# Patient Record
Sex: Male | Born: 1955 | Race: White | Hispanic: No | Marital: Married | Smoking: Never smoker
Health system: Southern US, Community
[De-identification: ages and names within clinical notes are randomized; demographics above are authoritative.]

## PROBLEM LIST (undated history)

## (undated) DIAGNOSIS — G4730 Sleep apnea, unspecified: Secondary | ICD-10-CM

## (undated) DIAGNOSIS — T7840XA Allergy, unspecified, initial encounter: Secondary | ICD-10-CM

## (undated) DIAGNOSIS — M1990 Unspecified osteoarthritis, unspecified site: Secondary | ICD-10-CM

## (undated) DIAGNOSIS — K219 Gastro-esophageal reflux disease without esophagitis: Secondary | ICD-10-CM

## (undated) DIAGNOSIS — E7800 Pure hypercholesterolemia, unspecified: Secondary | ICD-10-CM

## (undated) DIAGNOSIS — Z86718 Personal history of other venous thrombosis and embolism: Secondary | ICD-10-CM

## (undated) DIAGNOSIS — I2510 Atherosclerotic heart disease of native coronary artery without angina pectoris: Secondary | ICD-10-CM

## (undated) DIAGNOSIS — Z9889 Other specified postprocedural states: Secondary | ICD-10-CM

## (undated) DIAGNOSIS — I10 Essential (primary) hypertension: Secondary | ICD-10-CM

## (undated) DIAGNOSIS — Z87442 Personal history of urinary calculi: Secondary | ICD-10-CM

## (undated) DIAGNOSIS — Z8719 Personal history of other diseases of the digestive system: Secondary | ICD-10-CM

## (undated) DIAGNOSIS — J189 Pneumonia, unspecified organism: Secondary | ICD-10-CM

## (undated) DIAGNOSIS — T8859XA Other complications of anesthesia, initial encounter: Secondary | ICD-10-CM

## (undated) DIAGNOSIS — R0600 Dyspnea, unspecified: Secondary | ICD-10-CM

## (undated) DIAGNOSIS — N200 Calculus of kidney: Secondary | ICD-10-CM

## (undated) HISTORY — PX: OTHER SURGICAL HISTORY: SHX169

## (undated) HISTORY — PX: CHOLECYSTECTOMY: SHX55

## (undated) HISTORY — PX: UPPER GASTROINTESTINAL ENDOSCOPY: SHX188

## (undated) HISTORY — DX: Essential (primary) hypertension: I10

## (undated) HISTORY — PX: EYE SURGERY: SHX253

## (undated) HISTORY — PX: CATARACT EXTRACTION, BILATERAL: SHX1313

## (undated) HISTORY — PX: HERNIA REPAIR: SHX51

## (undated) HISTORY — DX: Calculus of kidney: N20.0

## (undated) HISTORY — DX: Personal history of other venous thrombosis and embolism: Z86.718

## (undated) HISTORY — DX: Allergy, unspecified, initial encounter: T78.40XA

## (undated) HISTORY — DX: Gastro-esophageal reflux disease without esophagitis: K21.9

## (undated) HISTORY — DX: Sleep apnea, unspecified: G47.30

## (undated) HISTORY — DX: Pure hypercholesterolemia, unspecified: E78.00

---

## 2006-09-25 ENCOUNTER — Emergency Department (HOSPITAL_COMMUNITY): Admission: EM | Admit: 2006-09-25 | Discharge: 2006-09-26 | Payer: Self-pay | Admitting: Emergency Medicine

## 2010-08-09 ENCOUNTER — Ambulatory Visit (HOSPITAL_BASED_OUTPATIENT_CLINIC_OR_DEPARTMENT_OTHER): Admission: RE | Admit: 2010-08-09 | Discharge: 2010-08-09 | Payer: Self-pay | Admitting: Ophthalmology

## 2010-12-10 LAB — POCT I-STAT, CHEM 8
BUN: 12 mg/dL (ref 6–23)
Calcium, Ion: 1.12 mmol/L (ref 1.12–1.32)
Creatinine, Ser: 1.2 mg/dL (ref 0.4–1.5)
Sodium: 141 mEq/L (ref 135–145)
TCO2: 24 mmol/L (ref 0–100)

## 2011-02-19 NOTE — Op Note (Signed)
NAMEDUVID, SMALLS NO.:  [id number]  MEDICAL RECORD NO.:  [id number]          PATIENT TYPE:  AMB  LOCATION:  DSC                          FACILITY:  [facility]  PHYSICIAN:  Pasty Spillers. Maple Hudson, M.D. 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  PROCEDURE: 1. Right superior oblique tuck, 10 mm. 2. Left inferior rectus muscle recession, 6.0 mm, adjustable     technique. 3. Left lateral rectus muscle recession, 4.0 mm.  SURGEON:  Pasty Spillers. Young, MD  ANESTHESIA:  General (laryngeal mask).  COMPLICATIONS:  None.  DESCRIPTION OF PROCEDURE:  After preoperative evaluation including informed consent, the patient was taken to the operating room where he was identified by me.  General anesthesia was induced without difficulty after placement of appropriate monitors.  The patient was prepped and draped in standard sterile fashion.  Lid speculum was placed in the right eye.  Through a superotemporal fornix incision through conjunctiva and Tenon fascia, the right superior rectus muscle was engaged on a series of muscle hooks.  The conjunctival incision was pulled over the nasal corner of the superior rectus insertion and drawn posteriorly with the Desmarres retractor to expose the tendon of the superior oblique along the nasal border of the superior rectus muscle.  The tendon was engaged on an oblique hook and then on a Bishop tendon tucker.  The tucker was drawn up to 5 mm each limb, to create a total tuck of 10 mm.  A double- arm 6-0 Mersilene suture was passed in horizontal mattress fashion through the tendon at the base of the tuck, anteriorly to posteriorly, then posteriorly to anteriorly, then tied securely  around the tendon.  The tucker was removed.  Conjunctiva was closed with two 6-0 plain gut sutures.  Attention was directed to the left eye, where an inferior limbal peritomy of 2 o'clock hours extent was made with Westcott scissors, relaxing incisions in the inferonasal and inferotemporal quadrants.  The peritomy was continued around to the 9 o'clock position to allow access to the lateral rectus muscle, which was engaged on a series of muscle hooks and cleared of its fascial attachments.  The tendon was secured with a double-arm 6-0 Vicryl suture, with a double-lockicking bite at each border of the muscle, 1 mm from the insertion.  The muscle was disinserted, and was reattached to sclera at a measured distance of 4.0 mm posterior to the original insertion, using direct scleral passes in crossed-swords fashion.  The suture ends were tied securely after the position of the muscle had been checked and found to be accurate.  The conjunctival flap was reapposed to the limbus, with a 6-0 plain gut suture.  The left inferior rectus muscle was then engaged on a series of muscle hooks and cleared of its fascial attachments.  The tendon was secured with a double-arm 6-0 Vicryl suture, with a double-locking bite at each border  of the muscle, 1 mm from the insertion.  The muscle was disinserted.  Each pole suture was passed back into the original muscle stump in crossed- swords fashion.  The muscle was drawn up to the level of the original insertion.  The two pole sutures were tied together approximately 10 cm above the sclera.  The pole sutures were then joined with a needle driver at a measured distance of 6.0 mm above sclera.  A noose suture of 6-0 Vicryl was tied around the pole sutures at this location.  The inferior rectus was then allowed to retract until the noose suture reached sclera, creating a 6-mm hang-back recession.  The nasal corner of the inferior conjunctival flap was reapposed  with a 6-0 plain gut suture.  The temporal corner of the inferior conjunctival flap was loosely joined to sclera with a large loop of 6-0 plain gut, leaving the flap open to facilitate suture adjustment.  A traction suture of 6-0 silk was placed at the inferior limbus.  TobraDex ointment was placed in each eye.  The patient was awakened without difficulty and taken to the recovery room in stable condition, having suffered no intraoperative or immediate postoperative complications.     Pasty Spillers. Maple Hudson, M.D.     Cheron Schaumann  D:  08/09/2010  T:  08/10/2010  Job:  [id number]  Electronically Signed by Verne Carrow M.D. on 02/19/2011 59:56:38 AM

## 2015-09-30 HISTORY — PX: OTHER SURGICAL HISTORY: SHX169

## 2018-06-02 DIAGNOSIS — Z0000 Encounter for general adult medical examination without abnormal findings: Secondary | ICD-10-CM

## 2019-10-10 ENCOUNTER — Encounter (HOSPITAL_COMMUNITY): Payer: Self-pay

## 2019-10-10 ENCOUNTER — Emergency Department (HOSPITAL_COMMUNITY): Payer: No Typology Code available for payment source

## 2019-10-10 ENCOUNTER — Emergency Department (HOSPITAL_COMMUNITY)
Admission: EM | Admit: 2019-10-10 | Discharge: 2019-10-11 | Disposition: A | Discharge status: Home / Self Care | Payer: No Typology Code available for payment source | Attending: Emergency Medicine | Admitting: Emergency Medicine

## 2019-10-10 ENCOUNTER — Other Ambulatory Visit: Payer: Self-pay

## 2019-10-10 DIAGNOSIS — R0789 Other chest pain: Secondary | ICD-10-CM | POA: Diagnosis present

## 2019-10-10 DIAGNOSIS — R072 Precordial pain: Secondary | ICD-10-CM | POA: Diagnosis not present

## 2019-10-10 LAB — CBC
HCT: 54.3 % — ABNORMAL HIGH (ref 39.0–52.0)
Hemoglobin: 17.9 g/dL — ABNORMAL HIGH (ref 13.0–17.0)
MCH: 31.1 pg (ref 26.0–34.0)
MCHC: 33 g/dL (ref 30.0–36.0)
MCV: 94.3 fL (ref 80.0–100.0)
Platelets: 234 10*3/uL (ref 150–400)
RBC: 5.76 MIL/uL (ref 4.22–5.81)
RDW: 14.2 % (ref 11.5–15.5)
WBC: 13 10*3/uL — ABNORMAL HIGH (ref 4.0–10.5)
nRBC: 0 % (ref 0.0–0.2)

## 2019-10-10 LAB — BASIC METABOLIC PANEL
Anion gap: 11 (ref 5–15)
BUN: 19 mg/dL (ref 8–23)
CO2: 25 mmol/L (ref 22–32)
Calcium: 9.4 mg/dL (ref 8.9–10.3)
Chloride: 103 mmol/L (ref 98–111)
Creatinine, Ser: 1.02 mg/dL (ref 0.61–1.24)
GFR calc Af Amer: >60 mL/min (ref >60)
GFR calc non Af Amer: >60 mL/min (ref >60)
Glucose, Bld: 99 mg/dL (ref 70–99)
Potassium: 3.9 mmol/L (ref 3.5–5.1)
Sodium: 139 mmol/L (ref 135–145)

## 2019-10-10 LAB — TROPONIN I (HIGH SENSITIVITY)
Troponin I (High Sensitivity): <2 ng/L (ref <18)
Troponin I (High Sensitivity): 2 ng/L (ref <18)

## 2019-10-10 MED ORDER — ALUM & MAG HYDROXIDE-SIMETH 200-200-20 MG/5ML PO SUSP
15.0000 mL | Freq: Once | ORAL | Status: AC
  Administered 2019-10-11: 15 mL via ORAL
  Filled 2019-10-10: qty 30

## 2019-10-10 MED ORDER — FAMOTIDINE 20 MG PO TABS
20.0000 mg | ORAL_TABLET | Freq: Once | ORAL | Status: AC
  Administered 2019-10-11: 20 mg via ORAL
  Filled 2019-10-10: qty 1

## 2019-10-10 MED ORDER — ACETAMINOPHEN 500 MG PO TABS
1000.0000 mg | ORAL_TABLET | Freq: Once | ORAL | Status: AC
  Administered 2019-10-11: 1000 mg via ORAL
  Filled 2019-10-10: qty 2

## 2019-10-10 MED ORDER — SODIUM CHLORIDE 0.9% FLUSH: 3.0000 mL | Freq: Once | INTRAVENOUS | Status: DC

## 2019-10-10 NOTE — ED Triage Notes (Signed)
Per EMS- patient c/o constant left chest pain and SOB since last night. Patient also c/o tingling of th eleft am and fingers.

## 2019-10-10 NOTE — ED Provider Notes (Signed)
[facility] DEPT Provider Note   CSN: [id number] Arrival date & time: 10/10/19  1526     History Chief Complaint  Patient presents with  . Chest Pain    Stephen Nash is a 64 y.o. male.  Patient w hx cad/stent 2017, c/o mid chest pain since last pm around 10 pm. Symptoms occur at rest, moderate, dull, constant, persistent, nonradiating, not pleuritic. Denies chest wall injury or strain. Denies cough or uri symptoms.  No known covid + exposure. Denies any recent exertional cp or discomfort, denies any unusual fatigue or doe. No leg pain or swelling. No hx dvt or pe. ?hx reflux - occasional heartburn. States current symptoms unlike cp prior to stent, which was a heaviness and smothering sensation in chest/neck - no recent similar symptoms.   The history is provided by the patient.  Chest Pain Associated symptoms: no abdominal pain, no back pain, no cough, no fever, no headache, no nausea, no palpitations, no shortness of breath and no vomiting        History reviewed. No pertinent past medical history.  There are no problems to display for this patient.   Past Surgical History:  Procedure Laterality Date  . coronary artery stent         Family History  Problem Relation Age of Onset  . Heart failure Mother   . Stroke Father   . Heart failure Father     Social History   Tobacco Use  . Smoking status: Never Smoker  . Smokeless tobacco: Never Used  Substance Use Topics  . Alcohol use: Never  . Drug use: Never    Home Medications Prior to Admission medications   Not on File    Allergies    Percocet [oxycodone-acetaminophen]  Review of Systems   Review of Systems  Constitutional: Negative for fever.  HENT: Negative for sore throat.   Eyes: Negative for redness.  Respiratory: Negative for cough and shortness of breath.   Cardiovascular: Positive for chest pain. Negative for palpitations and leg swelling.  Gastrointestinal:  Negative for abdominal pain, nausea and vomiting.  Genitourinary: Negative for flank pain.  Musculoskeletal: Negative for back pain and neck pain.  Skin: Negative for rash.  Neurological: Negative for headaches.  Hematological: Does not bruise/bleed easily.  Psychiatric/Behavioral: Negative for confusion.    Physical Exam Updated Vital Signs BP (!) 136/94 (BP Location: Left Arm)   Pulse 89   Temp 98.6 F (37 C) (Oral)   Resp (!) 22   Ht 1.702 m (5\' 7" )   Wt 106.1 kg   SpO2 96%   BMI 36.65 kg/m   Physical Exam Vitals and nursing note reviewed.  Constitutional:      Appearance: Normal appearance. He is well-developed.  HENT:     Head: Atraumatic.     Nose: Nose normal.     Mouth/Throat:     Mouth: Mucous membranes are moist.     Pharynx: Oropharynx is clear.  Eyes:     General: No scleral icterus.    Conjunctiva/sclera: Conjunctivae normal.  Neck:     Trachea: No tracheal deviation.  Cardiovascular:     Rate and Rhythm: Normal rate and regular rhythm.     Pulses: Normal pulses.     Heart sounds: Normal heart sounds. No murmur. No friction rub. No gallop.   Pulmonary:     Effort: Pulmonary effort is normal. No accessory muscle usage or respiratory distress.     Breath sounds: Normal breath sounds.  Chest:     Chest wall: Tenderness present.  Abdominal:     General: Bowel sounds are normal. There is no distension.     Palpations: Abdomen is soft.     Tenderness: There is no abdominal tenderness. There is no guarding.  Genitourinary:    Comments: No cva tenderness. Musculoskeletal:        General: No swelling or tenderness.     Cervical back: Normal range of motion and neck supple. No rigidity.     Right lower leg: No edema.     Left lower leg: No edema.  Skin:    General: Skin is warm and dry.     Findings: No rash.  Neurological:     Mental Status: He is alert.     Comments: Alert, speech clear.   Psychiatric:        Mood and Affect: Mood normal.      ED Results / Procedures / Treatments   Labs (all labs ordered are listed, but only abnormal results are displayed) Results for orders placed or performed during the hospital encounter of [id number]  Basic metabolic panel  Result Value Ref Range   Sodium 139 135 - 145 mmol/L   Potassium 3.9 3.5 - 5.1 mmol/L   Chloride 103 98 - 111 mmol/L   CO2 25 22 - 32 mmol/L   Glucose, Bld 99 70 - 99 mg/dL   BUN 19 8 - 23 mg/dL   Creatinine, Ser 1.02 0.61 - 1.24 mg/dL   Calcium 9.4 8.9 - 10.3 mg/dL   GFR calc non Af Amer >60 >60 mL/min   GFR calc Af Amer >60 >60 mL/min   Anion gap 11 5 - 15  CBC  Result Value Ref Range   WBC 13.0 (H) 4.0 - 10.5 K/uL   RBC 5.76 4.22 - 5.81 MIL/uL   Hemoglobin 17.9 (H) 13.0 - 17.0 g/dL   HCT 54.3 (H) 39.0 - 52.0 %   MCV 94.3 80.0 - 100.0 fL   MCH 31.1 26.0 - 34.0 pg   MCHC 33.0 30.0 - 36.0 g/dL   RDW 14.2 11.5 - 15.5 %   Platelets 234 150 - 400 K/uL   nRBC 0.0 0.0 - 0.2 %  Troponin I (High Sensitivity)  Result Value Ref Range   Troponin I (High Sensitivity) <2 <18 ng/L  Troponin I (High Sensitivity)  Result Value Ref Range   Troponin I (High Sensitivity) 2 <18 ng/L   DG Chest 2 View  Result Date: 10/10/2019 CLINICAL DATA:  Chest pain and shortness of breath starting last night EXAM: CHEST - 2 VIEW COMPARISON:  Today 10:17 a.m. at [facility] FINDINGS: The lungs remain clear. Upper normal heart size. No blunting of the costophrenic angles. Thoracic spondylosis is present. Atherosclerotic calcification of the aortic arch. IMPRESSION: 1. No active cardiopulmonary disease is radiographically apparent. No change from radiographs from this morning at [facility]. 2.  Aortic Atherosclerosis (ICD10-I70.0). 3. Thoracic spondylosis. Electronically Signed   By: Van Clines M.D.   On: 10/10/2019 16:20    EKG EKG Interpretation  Date/Time:  Monday October 10 2019 15:36:12 EST Ventricular Rate:  110 PR  Interval:  162 QRS Duration: 88 QT Interval:  348 QTC Calculation: 470 R Axis:   -167 Text Interpretation: Sinus tachycardia Right superior axis deviation Right ventricular hypertrophy Confirmed by Lajean Saver [phone]) on 10/10/2019 6:54:42 PM   Radiology DG Chest 2 View  Result Date: 10/10/2019 CLINICAL DATA:  Chest pain  and shortness of breath starting last night EXAM: CHEST - 2 VIEW COMPARISON:  Today 10:17 a.m. at [facility]. FINDINGS: The lungs remain clear. Upper normal heart size. No blunting of the costophrenic angles. Thoracic spondylosis is present. Atherosclerotic calcification of the aortic arch. IMPRESSION: 1. No active cardiopulmonary disease is radiographically apparent. No change from radiographs from this morning at [facility]. 2.  Aortic Atherosclerosis (ICD10-I70.0). 3. Thoracic spondylosis. Electronically Signed   By: Van Clines M.D.   On: 10/10/2019 16:20    Procedures Procedures (including critical care time)  Medications Ordered in ED Medications  sodium chloride flush (NS) 0.9 % injection 3 mL (has no administration in time range)  acetaminophen (TYLENOL) tablet 1,000 mg (has no administration in time range)  famotidine (PEPCID) tablet 20 mg (has no administration in time range)  alum & mag hydroxide-simeth (MAALOX/MYLANTA) 200-200-20 MG/5ML suspension 15 mL (has no administration in time range)    ED Course  I have reviewed the triage vital signs and the nursing notes.  Pertinent labs & imaging results that were available during my care of the patient were reviewed by me and considered in my medical decision making (see chart for details).    MDM Rules/Calculators/A&P                      Iv ns. Stat labs. Imaging. Ecg.   Reviewed nursing notes and prior charts for additional history.  Evaluation at [address] this AM  - trop then normal.  After symptoms constant/present for 24 hours, trop x 2 normal  and not increasing - felt not c/w ACS.   Will try acetaminophen, pepcid and maalox for symptom relief.  Although ED workup reassuring, given hx cad, will rec close outpt cardiology f/u  - pt indicates his former cardiologist in [place] not practicing - will provide Card/HC information.   Currently on monitor, hr 84, sinus, rr 16, pulse ox 98% room air. No increased wob. No apparent distress or discomfort.   Patient appears stable for d/c.   Rec close outpt cardiology f/u.  Return precautions provided.     Final Clinical Impression(s) / ED Diagnoses Final diagnoses:  None    Rx / DC Orders ED Discharge Orders    None       Lajean Saver, MD 10/10/19 2353

## 2019-10-11 DIAGNOSIS — R072 Precordial pain: Secondary | ICD-10-CM | POA: Diagnosis not present

## 2019-10-11 NOTE — Discharge Instructions (Addendum)
It was our pleasure to provide your ER care today - we hope that you feel better.  Continue your antacid medication. If reflux symptoms, you may also try pepcid and maalox for symptom relief.   For recent chest discomfort - follow up with cardiologist in the coming week - call office later this AM to arrange appointment.   Return to ER if worse, new symptoms, increased trouble breathing, recurrent or persistent chest pain, high fevers, or other concern.

## 2019-10-11 NOTE — ED Notes (Signed)
Signature pad not working. 

## 2021-08-09 ENCOUNTER — Telehealth: Payer: Self-pay | Admitting: *Deleted

## 2021-08-09 ENCOUNTER — Encounter: Payer: Self-pay | Admitting: Gastroenterology

## 2021-08-09 ENCOUNTER — Ambulatory Visit (INDEPENDENT_AMBULATORY_CARE_PROVIDER_SITE_OTHER): Payer: Medicare Other | Admitting: Gastroenterology

## 2021-08-09 VITALS — BP 114/70 | HR 82 | Ht 67.0 in | Wt 231.2 lb

## 2021-08-09 DIAGNOSIS — R1319 Other dysphagia: Secondary | ICD-10-CM | POA: Diagnosis not present

## 2021-08-09 DIAGNOSIS — K219 Gastro-esophageal reflux disease without esophagitis: Secondary | ICD-10-CM | POA: Diagnosis not present

## 2021-08-09 DIAGNOSIS — Z1211 Encounter for screening for malignant neoplasm of colon: Secondary | ICD-10-CM | POA: Diagnosis not present

## 2021-08-09 NOTE — Telephone Encounter (Signed)
  08/09/2021   RE: Stephen Nash DOB: 1956/07/01 MRN: [id number]   Dear Dr Nicanor Alcon ,    We have scheduled the above patient for an endoscopic procedure. Our records show that he is on anticoagulation therapy.   Please advise as to how long the patient may come off his therapy of Eliquis prior to the procedure, which is scheduled for 10/02/2021.  Please fax back/ or route the completed form to [facility] at [phone].   Sincerely,    Kavitha Nandigam,MD

## 2021-08-09 NOTE — Patient Instructions (Signed)
You have been scheduled for a Barium Esophogram at [facility] (1st floor of the hospital) on 09/06/2021 at 9:30am. Please arrive 30 minutes prior to your appointment for registration. Make certain not to have anything to eat or drink 3 hours prior to your test. If you need to reschedule for any reason, please contact radiology at [phone] to do so. __________________________________________________________________ A barium swallow is an examination that concentrates on views of the esophagus. This tends to be a double contrast exam (barium and two liquids which, when combined, create a gas to distend the wall of the oesophagus) or single contrast (non-ionic iodine based). The study is usually tailored to your symptoms so a good history is essential. Attention is paid during the study to the form, structure and configuration of the esophagus, looking for functional disorders (such as aspiration, dysphagia, achalasia, motility and reflux) EXAMINATION You may be asked to change into a gown, depending on the type of swallow being performed. A radiologist and radiographer will perform the procedure. The radiologist will advise you of the type of contrast selected for your procedure and direct you during the exam. You will be asked to stand, sit or lie in several different positions and to hold a small amount of fluid in your mouth before being asked to swallow while the imaging is performed .In some instances you may be asked to swallow barium coated marshmallows to assess the motility of a solid food bolus. The exam can be recorded as a digital or video fluoroscopy procedure. POST PROCEDURE It will take 1-2 days for the barium to pass through your system. To facilitate this, it is important, unless otherwise directed, to increase your fluids for the next 24-48hrs and to resume your normal diet.  This test typically takes about 30 minutes to perform.   You have been scheduled for an endoscopy  and colonoscopy. Please follow the written instructions given to you at your visit today. Please pick up your prep supplies at the pharmacy within the next 1-3 days. If you use inhalers (even only as needed), please bring them with you on the day of your procedure.   Continue Protonix   AVOID dense bread,dry tough meat until Endoscopy  You will be contacted by our office prior to your procedure for directions on holding your Eliquis .  If you do not hear from our office 1 week prior to your scheduled procedure, please call [phone] to discuss.   Gastroesophageal Reflux Disease, Adult Gastroesophageal reflux (GER) happens when acid from the stomach flows up into the tube that connects the mouth and the stomach (esophagus). Normally, food travels down the esophagus and stays in the stomach to be digested. However, when a person has GER, food and stomach acid sometimes move back up into the esophagus. If this becomes a more serious problem, the person may be diagnosed with a disease called gastroesophageal reflux disease (GERD). GERD occurs when the reflux: Happens often. Causes frequent or severe symptoms. Causes problems such as damage to the esophagus. When stomach acid comes in contact with the esophagus, the acid may cause inflammation in the esophagus. Over time, GERD may create small holes (ulcers) in the lining of the esophagus. What are the causes? This condition is caused by a problem with the muscle between the esophagus and the stomach (lower esophageal sphincter, or LES). Normally, the LES muscle closes after food passes through the esophagus to the stomach. When the LES is weakened or abnormal, it does not close  properly, and that allows food and stomach acid to go back up into the esophagus. The LES can be weakened by certain dietary substances, medicines, and medical conditions, including: Tobacco use. Pregnancy. Having a hiatal hernia. Alcohol use. Certain foods and  beverages, such as coffee, chocolate, onions, and peppermint. What increases the risk? You are more likely to develop this condition if you: Have an increased body weight. Have a connective tissue disorder. Take NSAIDs, such as ibuprofen. What are the signs or symptoms? Symptoms of this condition include: Heartburn. Difficult or painful swallowing and the feeling of having a lump in the throat. A bitter taste in the mouth. Bad breath and having a large amount of saliva. Having an upset or bloated stomach and belching. Chest pain. Different conditions can cause chest pain. Make sure you see your health care provider if you experience chest pain. Shortness of breath or wheezing. Ongoing (chronic) cough or a nighttime cough. Wearing away of tooth enamel. Weight loss. How is this diagnosed? This condition may be diagnosed based on a medical history and a physical exam. To determine if you have mild or severe GERD, your health care provider may also monitor how you respond to treatment. You may also have tests, including: A test to examine your stomach and esophagus with a small camera (endoscopy). A test that measures the acidity level in your esophagus. A test that measures how much pressure is on your esophagus. A barium swallow or modified barium swallow test to show the shape, size, and functioning of your esophagus. How is this treated? Treatment for this condition may vary depending on how severe your symptoms are. Your health care provider may recommend: Changes to your diet. Medicine. Surgery. The goal of treatment is to help relieve your symptoms and to prevent complications. Follow these instructions at home: Eating and drinking  Follow a diet as recommended by your health care provider. This may involve avoiding foods and drinks such as: Coffee and tea, with or without caffeine. Drinks that contain alcohol. Energy drinks and sports drinks. Carbonated drinks or  sodas. Chocolate and cocoa. Peppermint and mint flavorings. Garlic and onions. Horseradish. Spicy and acidic foods, including peppers, chili powder, curry powder, vinegar, hot sauces, and barbecue sauce. Citrus fruit juices and citrus fruits, such as oranges, lemons, and limes. Tomato-based foods, such as red sauce, chili, salsa, and pizza with red sauce. Fried and fatty foods, such as donuts, french fries, potato chips, and high-fat dressings. High-fat meats, such as hot dogs and fatty cuts of red and white meats, such as rib eye steak, sausage, ham, and bacon. High-fat dairy items, such as whole milk, butter, and cream cheese. Eat small, frequent meals instead of large meals. Avoid drinking large amounts of liquid with your meals. Avoid eating meals during the 2-3 hours before bedtime. Avoid lying down right after you eat. Do not exercise right after you eat. Lifestyle  Do not use any products that contain nicotine or tobacco. These products include cigarettes, chewing tobacco, and vaping devices, such as e-cigarettes. If you need help quitting, ask your health care provider. Try to reduce your stress by using methods such as yoga or meditation. If you need help reducing stress, ask your health care provider. If you are overweight, reduce your weight to an amount that is healthy for you. Ask your health care provider for guidance about a safe weight loss goal. General instructions Pay attention to any changes in your symptoms. Take over-the-counter and prescription medicines only as  told by your health care provider. Do not take aspirin, ibuprofen, or other NSAIDs unless your health care provider told you to take these medicines. Wear loose-fitting clothing. Do not wear anything tight around your waist that causes pressure on your abdomen. Raise (elevate) the head of your bed about 6 inches (15 cm). You can use a wedge to do this. Avoid bending over if this makes your symptoms  worse. Keep all follow-up visits. This is important. Contact a health care provider if: You have: New symptoms. Unexplained weight loss. Difficulty swallowing or it hurts to swallow. Wheezing or a persistent cough. A hoarse voice. Your symptoms do not improve with treatment. Get help right away if: You have sudden pain in your arms, neck, jaw, teeth, or back. You suddenly feel sweaty, dizzy, or light-headed. You have chest pain or shortness of breath. You vomit and the vomit is green, yellow, or black, or it looks like blood or coffee grounds. You faint. You have stool that is red, bloody, or black. You cannot swallow, drink, or eat. These symptoms may represent a serious problem that is an emergency. Do not wait to see if the symptoms will go away. Get medical help right away. Call your local emergency services (911 in the [place] Do not drive yourself to the hospital. Summary Gastroesophageal reflux happens when acid from the stomach flows up into the esophagus. GERD is a disease in which the reflux happens often, causes frequent or severe symptoms, or causes problems such as damage to the esophagus. Treatment for this condition may vary depending on how severe your symptoms are. Your health care provider may recommend diet and lifestyle changes, medicine, or surgery. Contact a health care provider if you have new or worsening symptoms. Take over-the-counter and prescription medicines only as told by your health care provider. Do not take aspirin, ibuprofen, or other NSAIDs unless your health care provider told you to do so. Keep all follow-up visits as told by your health care provider. This is important. This information is not intended to replace advice given to you by your health care provider. Make sure you discuss any questions you have with your health care provider. Document Revised: 03/26/2020 Document Reviewed: 03/26/2020 Elsevier Patient Education  2022 [employer].  Due to  recent changes in healthcare laws, you may see the results of your imaging and laboratory studies on MyChart before your provider has had a chance to review them.  We understand that in some cases there may be results that are confusing or concerning to you. Not all laboratory results come back in the same time frame and the provider may be waiting for multiple results in order to interpret others.  Please give [place] 48 hours in order for your provider to thoroughly review all the results before contacting the office for clarification of your results.    If you are age 46 or older, your body mass index should be between 23-30. Your Body mass index is 36.22 kg/m. If this is out of the aforementioned range listed, please consider follow up with your Primary Care Provider.  If you are age 36 or younger, your body mass index should be between 19-25. Your Body mass index is 36.22 kg/m. If this is out of the aformentioned range listed, please consider follow up with your Primary Care Provider.   ________________________________________________________  The [facility] providers would like to encourage you to use [facility] to communicate with providers for non-urgent requests or questions.  Due to  long hold times on the telephone, sending your provider a message by [facility] may be a faster and more efficient way to get a response.  Please allow 48 business hours for a response.  Please remember that this is for non-urgent requests.  _______________________________________________________   I appreciate the  opportunity to care for you  Thank You   Harl Bowie , MD      __________________________________________________________________________________

## 2021-08-09 NOTE — Progress Notes (Signed)
Stephen Nash    [id number]    [birth year]  Primary Care Physician:Patient, No Pcp Per (Inactive)  Referring Physician: Charlynn Court, NP [address] [place],  [place] [address]   Chief complaint:  Dysphagia, gerd  HPI:  65 year old very pleasant gentleman here with complaints of intermittent dysphagia.  He has intermittent sensation of difficulty swallowing.  He feels like he is choking in his neck and cannot swallow or breathe anything when that happens.  He regurgitates food back occasionally.  Denies any episodes of food impaction that needed ER visits. 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 He is followed by cardiology in [facility], he has had work-up for syncope with negative Zio patch.  History of CAD s/p left heart cath and drug-eluting stent to LAD in 2017.  He is on low-dose diuretics.  He also has stable right bundle branch block and chronic hypertension He has obstructive sleep apnea  Outpatient Encounter Medications as of 08/09/2021  Medication Sig   albuterol (ACCUNEB) 1.25 MG/3ML nebulizer solution Inhale into the lungs.   allopurinol (ZYLOPRIM) 300 MG tablet Take by mouth.   apixaban (ELIQUIS) 5 MG TABS tablet Take by mouth.   aspirin 81 MG EC tablet Take by mouth.   atorvastatin (LIPITOR) 80 MG tablet Take by mouth.   dicyclomine (BENTYL) 20 MG tablet Take 20 mg by mouth every 6 (six) hours.   losartan (COZAAR) 100 MG tablet Take by mouth.   pantoprazole (PROTONIX) 40 MG tablet Take 40 mg by mouth daily.   potassium chloride (KLOR-CON) 10 MEQ tablet Take by mouth.   Simethicone (PHAZYME) 180 MG CAPS Take 1 mg by mouth daily.   torsemide (DEMADEX) 10 MG tablet Take 1 tablet by mouth daily.   No facility-administered encounter medications on file as of 08/09/2021.    Allergies as of 08/09/2021 - Review Complete 08/09/2021  Allergen Reaction Noted   Percocet  [oxycodone-acetaminophen]  10/10/2019    Past Medical History:  Diagnosis Date   Elevated cholesterol    GERD (gastroesophageal reflux disease)    High blood pressure    History of blood clots    Kidney stone    Sleep apnea     Past Surgical History:  Procedure Laterality Date   CHOLECYSTECTOMY     coronary artery stent     HERNIA REPAIR      Family History  Problem Relation Age of Onset   Heart failure Mother    Stroke Father    Heart failure Father    Colon cancer Maternal Grandfather    Kidney cancer Other        uncle    Social History   Socioeconomic History   Marital status: Married    Spouse name: Not on file   Number of children: 1   Years of education: Not on file   Highest education level: Not on file  Occupational History   Not on file  Tobacco Use   Smoking status: Never   Smokeless tobacco: Never  Vaping Use   Vaping Use: Never used  Substance and Sexual Activity   Alcohol use: Never   Drug use: Never   Sexual activity: Not on file  Other Topics Concern   Not on file  Social History Narrative   Not on file   Social Determinants of Health   Financial Resource  Strain: Not on file  Food Insecurity: Not on file  Transportation Needs: Not on file  Physical Activity: Not on file  Stress: Not on file  Social Connections: Not on file  Intimate Partner Violence: Not on file      Review of systems: All other review of systems negative except as mentioned in the HPI.   Physical Exam: Vitals:   08/09/21 1025  BP: 114/70  Pulse: 82   Body mass index is 36.22 kg/m. Gen:      No acute distress HEENT:  sclera anicteric Abd:      soft, non-tender; no palpable masses, no distension Ext:    No edema Neuro: alert and oriented x 3 Psych: normal mood and affect  Data Reviewed:  Reviewed labs, radiology imaging, old records and pertinent past GI work up   Assessment and Plan/Recommendations:  65 year old very pleasant gentleman with  history of hypertension, hyperlipidemia, CAD, right bundle branch block, OSA and DVT on chronic Eliquis Dysphagia: We will plan to obtain barium esophagram prior to proceeding with EGD with esophageal dilation.  We will need to exclude significant esophageal dysmotility or proximal esophageal stricture  Advised patient to avoid tough meat or dense bread until EGD with esophageal dilation to prevent food impaction  GERD continue Protonix and antireflux measures  He is due for colorectal cancer screening-average risk, schedule for colonoscopy along with EGD Hold Eliquis for 2 days prior to the procedure, we will request clearance from cardiology  The risks and benefits as well as alternatives of endoscopic procedure(s) have been discussed and reviewed. All questions answered. The patient agrees to proceed.   The patient was provided an opportunity to ask questions and all were answered. The patient agreed with the plan and demonstrated an understanding of the instructions.  Damaris Hippo , MD    CC: Charlynn Court, NP

## 2021-08-26 NOTE — Telephone Encounter (Signed)
X3 faxed letter to [phone]

## 2021-08-28 ENCOUNTER — Telehealth: Payer: Self-pay

## 2021-08-28 ENCOUNTER — Telehealth: Payer: Self-pay | Admitting: Gastroenterology

## 2021-08-28 NOTE — Telephone Encounter (Signed)
Returned patient's wife's call and left message on the cell phone to please call back. Unable to get through on the home phone.

## 2021-08-28 NOTE — Telephone Encounter (Signed)
Spoke to wife and she states she was told the results of her husband's DG Esophagus test was normal. The hospital will not do his EGD/Colonoscopy as an in-patient because they said it is not urgent. But she states he passed out 4 times yesterday in the hospital while having difficulty swallowing/breathing. She is requesting an earlier EGD/Colonoscopy before his scheduled 10/02/21, but you have no openings. Please advise. The hospital is discharging him.

## 2021-08-28 NOTE — Telephone Encounter (Signed)
Patients wife called stated the patient is currently at [facility] and they already did the DG Esophagus test, will be sending over results. So she has canceled the appt he had scheduled. She is also seeking to move the procedure for a sooner date if possible states the patient is not well and they are also seeking for him to have it done sooner at the hospital they just do not have the availability as out patient for him.

## 2021-08-29 NOTE — Telephone Encounter (Signed)
Unfortunately we do not have any earlier available appointments to do EGD and colonoscopy, please add him to the cancellation list and try to bring him in sooner if I have any cancellations.  Please check if any other providers have any availability if patient is willing.  If he is experiencing worsening symptoms he will need to come to the ER.

## 2021-09-02 NOTE — Telephone Encounter (Signed)
I have faxed blood thinner request 3 times. It is an outside provider. I will have to actually call them

## 2021-09-02 NOTE — Telephone Encounter (Signed)
Monitoring the schedule for any cancellations with any provider. I do not see that the cardiologist has responded to the request for pharmacy clearance of the Eliquis.

## 2021-09-06 ENCOUNTER — Other Ambulatory Visit (HOSPITAL_COMMUNITY): Payer: Medicare Other

## 2021-09-09 ENCOUNTER — Telehealth: Payer: Self-pay | Admitting: Gastroenterology

## 2021-09-09 NOTE — Telephone Encounter (Signed)
Patient's wife returned your phone call. ?

## 2021-09-09 NOTE — Telephone Encounter (Signed)
Called Dr Coralee Pesa  with [facility], they will have a nurse call me back

## 2021-09-13 NOTE — Telephone Encounter (Signed)
Last dose of Eliquis will be Dec 25th.. Was advised by his Medical doctor and Dr Coralee Pesa as well. Spoke with Diane, the patients wife. He will be no longer on it

## 2021-10-02 ENCOUNTER — Encounter: Payer: Self-pay | Admitting: Gastroenterology

## 2021-10-02 ENCOUNTER — Ambulatory Visit (AMBULATORY_SURGERY_CENTER): Payer: Medicare Other | Admitting: Gastroenterology

## 2021-10-02 VITALS — BP 137/93 | HR 83 | Temp 98.7°F | Resp 11 | Ht 67.0 in | Wt 231.0 lb

## 2021-10-02 DIAGNOSIS — K2970 Gastritis, unspecified, without bleeding: Secondary | ICD-10-CM

## 2021-10-02 DIAGNOSIS — Z1211 Encounter for screening for malignant neoplasm of colon: Secondary | ICD-10-CM

## 2021-10-02 DIAGNOSIS — K319 Disease of stomach and duodenum, unspecified: Secondary | ICD-10-CM | POA: Diagnosis not present

## 2021-10-02 DIAGNOSIS — K219 Gastro-esophageal reflux disease without esophagitis: Secondary | ICD-10-CM | POA: Diagnosis not present

## 2021-10-02 DIAGNOSIS — R1319 Other dysphagia: Secondary | ICD-10-CM

## 2021-10-02 DIAGNOSIS — D120 Benign neoplasm of cecum: Secondary | ICD-10-CM

## 2021-10-02 DIAGNOSIS — K259 Gastric ulcer, unspecified as acute or chronic, without hemorrhage or perforation: Secondary | ICD-10-CM | POA: Diagnosis not present

## 2021-10-02 MED ORDER — SUCRALFATE 1 GM/10ML PO SUSP: 1.0000 g | Freq: Two times a day (BID) | ORAL | 1 refills | Status: DC

## 2021-10-02 MED ORDER — PANTOPRAZOLE SODIUM 40 MG PO TBEC: 40.0000 mg | DELAYED_RELEASE_TABLET | Freq: Every day | ORAL | 3 refills | Status: DC

## 2021-10-02 MED ORDER — SODIUM CHLORIDE 0.9 % IV SOLN: 500.0000 mL | INTRAVENOUS | Status: DC

## 2021-10-02 NOTE — Progress Notes (Signed)
Pt's states no medical or surgical changes since previsit or office visit. 

## 2021-10-02 NOTE — Progress Notes (Signed)
Called to room to assist during endoscopic procedure.  Patient ID and intended procedure confirmed with present staff. Received instructions for my participation in the procedure from the performing physician.  

## 2021-10-02 NOTE — Patient Instructions (Addendum)
Read all of the handouts given to you by your recovery room nurse.  Take your new medication a ordered. Watch your diet.  Try to decrease the acidic foods and drinks.  Read your handouts about hemorrhoid banding Use your CPAP due to your sleep apnea.  It  needs to be addressed.  YOU HAD AN ENDOSCOPIC PROCEDURE TODAY AT [facility]:   Refer to the procedure report that was given to you for any specific questions about what was found during the examination.  If the procedure report does not answer your questions, please call your gastroenterologist to clarify.  If you requested that your care partner not be given the details of your procedure findings, then the procedure report has been included in a sealed envelope for you to review at your convenience later.  YOU SHOULD EXPECT: Some feelings of bloating in the abdomen. Passage of more gas than usual.  Walking can help get rid of the air that was put into your GI tract during the procedure and reduce the bloating. If you had a lower endoscopy (such as a colonoscopy or flexible sigmoidoscopy) you may notice spotting of blood in your stool or on the toilet paper. If you underwent a bowel prep for your procedure, you may not have a normal bowel movement for a few days.  Please Note:  You might notice some irritation and congestion in your nose or some drainage.  This is from the oxygen used during your procedure.  There is no need for concern and it should clear up in a day or so.  SYMPTOMS TO REPORT IMMEDIATELY:  Following lower endoscopy (colonoscopy or flexible sigmoidoscopy):  Excessive amounts of blood in the stool  Significant tenderness or worsening of abdominal pains  Swelling of the abdomen that is new, acute  Fever of 100F or higher  For urgent or emergent issues, a gastroenterologist can be reached at any hour by calling [phone]. Do not use MyChart messaging for urgent concerns.    DIET:  We do recommend a small  meal at first, but then you may proceed to your regular diet.  Drink plenty of fluids but you should avoid alcoholic beverages for 24 hours. Try to eat a high fiber diet, and drink plenty of water.  ACTIVITY:  You should plan to take it easy for the rest of today and you should NOT DRIVE or use heavy machinery until tomorrow (because of the sedation medicines used during the test).    FOLLOW UP: Our staff will call the number listed on your records 48-72 hours following your procedure to check on you and address any questions or concerns that you may have regarding the information given to you following your procedure. If we do not reach you, we will leave a message.  We will attempt to reach you two times.  During this call, we will ask if you have developed any symptoms of COVID 19. If you develop any symptoms (ie: fever, flu-like symptoms, shortness of breath, cough etc.) before then, please call [phone].  If you test positive for Covid 19 in the 2 weeks post procedure, please call and report this information to [place].    If any biopsies were taken you will be contacted by phone or by letter within the next 1-3 weeks.  Please call us at [phone] if you have not heard about the biopsies in 3 weeks.    SIGNATURES/CONFIDENTIALITY: You and/or your care partner have signed paperwork which  will be entered into your electronic medical record.  These signatures attest to the fact that that the information above on your After Visit Summary has been reviewed and is understood.  Full responsibility of the confidentiality of this discharge information lies with you and/or your care-partner.

## 2021-10-02 NOTE — Progress Notes (Signed)
[facility] History and Physical   Primary Care Physician:  Patient, No Pcp Per (Inactive)   Reason for Procedure:  Dysphagia, colon cancer screening  Plan:    EGD and colonoscopy with possible interventions as needed     HPI: Stephen Nash is a very pleasant 65 y.o. male here for EGD for dysphagia evaluation and colonoscopy for colorectal cancer screening. Denies any nausea, vomiting, abdominal pain, melena or bright red blood per rectum  The risks and benefits as well as alternatives of endoscopic procedure(s) have been discussed and reviewed. All questions answered. The patient agrees to proceed.    Past Medical History:  Diagnosis Date   Elevated cholesterol    GERD (gastroesophageal reflux disease)    High blood pressure    History of blood clots    Kidney stone    Sleep apnea     Past Surgical History:  Procedure Laterality Date   CHOLECYSTECTOMY     coronary artery stent     HERNIA REPAIR      Prior to Admission medications   Medication Sig Start Date End Date Taking? Authorizing Provider  allopurinol (ZYLOPRIM) 300 MG tablet Take by mouth.   Yes [provider]  aspirin 81 MG EC tablet Take by mouth.   Yes [provider]  atorvastatin (LIPITOR) 80 MG tablet Take by mouth. 04/18/16  Yes [provider]  dicyclomine (BENTYL) 20 MG tablet Take 20 mg by mouth every 6 (six) hours.   Yes [provider]  hydrOXYzine (VISTARIL) 50 MG capsule Take by mouth. 08/28/21  Yes [provider]  losartan (COZAAR) 100 MG tablet Take by mouth. 12/19/20  Yes [provider]  pantoprazole (PROTONIX) 40 MG tablet Take 40 mg by mouth daily.   Yes [provider]  potassium chloride (KLOR-CON) 10 MEQ tablet Take by mouth. 05/22/21  Yes [provider]  Simethicone 180 MG CAPS Take 1 mg by mouth daily.   Yes [provider]  torsemide (DEMADEX) 10 MG tablet Take 1 tablet by mouth daily. 05/31/21   Yes [provider]  albuterol (ACCUNEB) 1.25 MG/3ML nebulizer solution Inhale into the lungs. 07/30/21 08/29/21  [provider]  apixaban (ELIQUIS) 5 MG TABS tablet Take by mouth.    [provider]    Current Outpatient Medications  Medication Sig Dispense Refill   allopurinol (ZYLOPRIM) 300 MG tablet Take by mouth.     aspirin 81 MG EC tablet Take by mouth.     atorvastatin (LIPITOR) 80 MG tablet Take by mouth.     dicyclomine (BENTYL) 20 MG tablet Take 20 mg by mouth every 6 (six) hours.     hydrOXYzine (VISTARIL) 50 MG capsule Take by mouth.     losartan (COZAAR) 100 MG tablet Take by mouth.     pantoprazole (PROTONIX) 40 MG tablet Take 40 mg by mouth daily.     potassium chloride (KLOR-CON) 10 MEQ tablet Take by mouth.     Simethicone 180 MG CAPS Take 1 mg by mouth daily.     torsemide (DEMADEX) 10 MG tablet Take 1 tablet by mouth daily.     albuterol (ACCUNEB) 1.25 MG/3ML nebulizer solution Inhale into the lungs.     apixaban (ELIQUIS) 5 MG TABS tablet Take by mouth.     Current Facility-Administered Medications  Medication Dose Route Frequency Provider Last Rate Last Admin   0.9 %  sodium chloride infusion  500 mL Intravenous Continuous Shermaine Rivet, Venia Minks, MD  Allergies as of 10/02/2021 - Review Complete 10/02/2021  Allergen Reaction Noted   Percocet [oxycodone-acetaminophen]  10/10/2019    Family History  Problem Relation Age of Onset   Heart failure Mother    Stroke Father    Heart failure Father    Colon cancer Maternal Grandfather    Kidney cancer Other        uncle    Social History   Socioeconomic History   Marital status: Married    Spouse name: Not on file   Number of children: 1   Years of education: Not on file   Highest education level: Not on file  Occupational History   Not on file  Tobacco Use   Smoking status: Never   Smokeless tobacco: Never  Vaping Use   Vaping Use: Never used  Substance and Sexual  Activity   Alcohol use: Never   Drug use: Never   Sexual activity: Not on file  Other Topics Concern   Not on file  Social History Narrative   Not on file   Social Determinants of Health   Financial Resource Strain: Not on file  Food Insecurity: Not on file  Transportation Needs: Not on file  Physical Activity: Not on file  Stress: Not on file  Social Connections: Not on file  Intimate Partner Violence: Not on file    Review of Systems:  All other review of systems negative except as mentioned in the HPI.  Physical Exam: Vital signs in last 24 hours: BP 123/89    Pulse (!) 117    Temp 98.7 F (37.1 C) (Temporal)    Ht 5\' 7"  (1.702 m)    Wt 231 lb (104.8 kg)    SpO2 96%    BMI 36.18 kg/m  General:   Alert, NAD Lungs:  Clear .   Heart:  Regular rate and rhythm Abdomen:  Soft, nontender and nondistended. Neuro/Psych:  Alert and cooperative. Normal mood and affect. A and O x 3  Reviewed labs, radiology imaging, old records and pertinent past GI work up  Patient is appropriate for planned procedure(s) and anesthesia in an ambulatory setting   K. Denzil Magnuson , MD [phone]

## 2021-10-02 NOTE — Progress Notes (Signed)
Upper discharge instructions were given too.  They were previously deleted

## 2021-10-02 NOTE — Op Note (Signed)
[place] Patient Name: Stephen Nash Procedure Date: 10/02/2021 1:25 PM MRN: [id number] Endoscopist: Mauri Pole , MD Age: 66 Referring MD:  Date of Birth: 11-May-1956 Gender: Male Account #: [id number] Procedure:                Upper GI endoscopy Indications:              Dysphagia Medicines:                Monitored Anesthesia Care Procedure:                Pre-Anesthesia Assessment:                           - Prior to the procedure, a History and Physical                            was performed, and patient medications and                            allergies were reviewed. The patient's tolerance of                            previous anesthesia was also reviewed. The risks                            and benefits of the procedure and the sedation                            options and risks were discussed with the patient.                            All questions were answered, and informed consent                            was obtained. Prior Anticoagulants: The patient has                            taken no previous anticoagulant or antiplatelet                            agents. ASA Grade Assessment: II - A patient with                            mild systemic disease. After reviewing the risks                            and benefits, the patient was deemed in                            satisfactory condition to undergo the procedure.                           After obtaining informed consent, the endoscope was  passed under direct vision. Throughout the                            procedure, the patient's blood pressure, pulse, and                            oxygen saturations were monitored continuously. The                            Endoscope was introduced through the mouth, and                            advanced to the second part of duodenum. The upper                            GI endoscopy was accomplished without  difficulty.                            The patient tolerated the procedure well. Scope In: Scope Out: Findings:                 A 5 cm hiatal hernia was present.                           No endoscopic abnormality was evident in the                            esophagus to explain the patient's complaint of                            dysphagia.                           Patchy moderate inflammation characterized by                            congestion (edema), erosions, erythema and shallow                            ulcerations was found in the prepyloric region of                            the stomach. Biopsies were taken with a cold                            forceps for Helicobacter pylori testing.                           The cardia and gastric fundus were normal on                            retroflexion.                           The examined duodenum was normal. Complications:  No immediate complications. Estimated Blood Loss:     Estimated blood loss was minimal. Impression:               - 5 cm hiatal hernia.                           - No endoscopic esophageal abnormality to explain                            patient's dysphagia.                           - Gastritis. Biopsied.                           - Normal examined duodenum. Recommendation:           - Patient has a contact number available for                            emergencies. The signs and symptoms of potential                            delayed complications were discussed with the                            patient. Return to normal activities tomorrow.                            Written discharge instructions were provided to the                            patient.                           - Resume previous diet.                           - Continue present medications.                           - Await pathology results.                           - Use Protonix (pantoprazole) 40 mg PO daily.                            - Use sucralfate suspension 1 gram PO BID for 2                            weeks.                           - Return to GI office in 2 months. Mauri Pole, MD 10/02/2021 3:04:38 PM This report has been signed electronically.

## 2021-10-02 NOTE — Progress Notes (Signed)
To pacu, VSS. Report to [email or web address] 

## 2021-10-02 NOTE — Op Note (Signed)
[place] Patient Name: Stephen Nash Procedure Date: 10/02/2021 1:25 PM MRN: [id number] Endoscopist: Mauri Pole , MD Age: 66 Referring MD:  Date of Birth: [birth year] Gender: Male Account #: [id number] Procedure:                Colonoscopy Indications:              Screening for colorectal malignant neoplasm Medicines:                Monitored Anesthesia Care Procedure:                Pre-Anesthesia Assessment:                           - Prior to the procedure, a History and Physical                            was performed, and patient medications and                            allergies were reviewed. The patient's tolerance of                            previous anesthesia was also reviewed. The risks                            and benefits of the procedure and the sedation                            options and risks were discussed with the patient.                            All questions were answered, and informed consent                            was obtained. Prior Anticoagulants: The patient has                            taken no previous anticoagulant or antiplatelet                            agents. He was on ELiquis for DVT.It was stopped in                            December 2022. ASA Grade Assessment: II - A patient                            with mild systemic disease. After reviewing the                            risks and benefits, the patient was deemed in                            satisfactory condition to undergo the procedure.  After obtaining informed consent, the colonoscope                            was passed under direct vision. Throughout the                            procedure, the patient's blood pressure, pulse, and                            oxygen saturations were monitored continuously. The                            Olympus Colonoscope [id number] was introduced through                            the  anus and advanced to the the cecum, identified                            by appendiceal orifice and ileocecal valve. The                            colonoscopy was performed without difficulty. The                            patient tolerated the procedure well. The quality                            of the bowel preparation was excellent. The                            ileocecal valve, appendiceal orifice, and rectum                            were photographed. Scope In: 2:31:49 PM Scope Out: 2:49:36 PM Scope Withdrawal Time: 0 hours 12 minutes 59 seconds  Total Procedure Duration: 0 hours 17 minutes 47 seconds  Findings:                 The perianal and digital rectal examinations were                            normal.                           A few small and large-mouthed diverticula were                            found in the sigmoid colon and ascending colon.                           Non-bleeding external and internal hemorrhoids were                            found during retroflexion. The hemorrhoids were  medium-sized. Complications:            No immediate complications. Estimated Blood Loss:     Estimated blood loss was minimal. Impression:               - Diverticulosis in the sigmoid colon and in the                            ascending colon.                           - Non-bleeding external and internal hemorrhoids.                           - No specimens collected. Recommendation:           - Patient has a contact number available for                            emergencies. The signs and symptoms of potential                            delayed complications were discussed with the                            patient. Return to normal activities tomorrow.                            Written discharge instructions were provided to the                            patient.                           - Resume previous diet.                            - Continue present medications.                           - Repeat colonoscopy in 10 years for screening                            purposes. Mauri Pole, MD 10/02/2021 3:00:52 PM This report has been signed electronically.

## 2021-10-03 ENCOUNTER — Telehealth: Payer: Self-pay | Admitting: Gastroenterology

## 2021-10-03 NOTE — Telephone Encounter (Signed)
Inbound call from patient wife, Levander Campion. States she was told to call in to let nurse or Dr. Silverio Decamp know patient did have swallow test in 08/2021 with [facility]. Need to know what the next steps can be

## 2021-10-04 ENCOUNTER — Telehealth: Payer: Self-pay | Admitting: *Deleted

## 2021-10-04 ENCOUNTER — Telehealth: Payer: Self-pay

## 2021-10-04 NOTE — Telephone Encounter (Signed)
°  Follow up Call-  Call back number 10/02/2021  Post procedure Call Back phone  # [phone] wife phone  Permission to leave phone message Yes  Some recent data might be hidden     Patient questions:  Do you have a fever, pain , or abdominal swelling? No. Pain Score  0 *  Have you tolerated food without any problems? Yes.    Have you been able to return to your normal activities? Yes.    Do you have any questions about your discharge instructions: Diet   No. Medications  No. Follow up visit  No.  Do you have questions or concerns about your Care? No.  Actions: * If pain score is 4 or above: No action needed, pain <4.  Have you developed a fever since your procedure? no  2.   Have you had an respiratory symptoms (SOB or cough) since your procedure? no  3.   Have you tested positive for COVID 19 since your procedure no  4.   Have you had any family members/close contacts diagnosed with the COVID 19 since your procedure?  no   If yes to any of these questions please route to Joylene John, RN and Joella Prince, RN

## 2021-10-04 NOTE — Telephone Encounter (Signed)
First post procedure follow up call, no answer 

## 2021-10-07 NOTE — Telephone Encounter (Signed)
I am not clear on what she is referring to. He just had a procedure.

## 2021-10-08 ENCOUNTER — Other Ambulatory Visit: Payer: Self-pay

## 2021-10-08 DIAGNOSIS — K219 Gastro-esophageal reflux disease without esophagitis: Secondary | ICD-10-CM

## 2021-10-08 DIAGNOSIS — R1319 Other dysphagia: Secondary | ICD-10-CM

## 2021-10-08 NOTE — Telephone Encounter (Signed)
I have reviewed barium esophagram in Care Everywhere.  No significant abnormality. His symptoms are likely predominantly secondary to hiatal hernia and GERD. Please schedule esophageal manometry to exclude any major esophageal motility disorder.  Please refer to [facility] surgery for hiatal hernia repair and antireflux surgery.  Thank you

## 2021-10-08 NOTE — Telephone Encounter (Signed)
Spoke with the patient and the spouse. Agrees to this plan of care. Manometry will be done Friday 10/25/21 at 8:30 am. Patient is instructed to arrive to [place] at 8:00 am 10/25/21. NPO after midnight.  Records faxed to [facility].

## 2021-10-10 ENCOUNTER — Encounter: Payer: Self-pay | Admitting: Gastroenterology

## 2021-10-16 ENCOUNTER — Encounter (HOSPITAL_COMMUNITY): Payer: Self-pay | Admitting: Gastroenterology

## 2021-10-25 ENCOUNTER — Encounter (HOSPITAL_COMMUNITY)
Admission: RE | Disposition: A | Discharge status: Home / Self Care | Payer: Self-pay | Source: Home / Self Care | Attending: Gastroenterology

## 2021-10-25 ENCOUNTER — Ambulatory Visit (HOSPITAL_COMMUNITY)
Admission: RE | Admit: 2021-10-25 | Discharge: 2021-10-25 | Disposition: A | Discharge status: Home / Self Care | Payer: Medicare Other | Attending: Gastroenterology | Admitting: Gastroenterology

## 2021-10-25 DIAGNOSIS — K219 Gastro-esophageal reflux disease without esophagitis: Secondary | ICD-10-CM

## 2021-10-25 DIAGNOSIS — K449 Diaphragmatic hernia without obstruction or gangrene: Secondary | ICD-10-CM | POA: Diagnosis not present

## 2021-10-25 DIAGNOSIS — R1319 Other dysphagia: Secondary | ICD-10-CM

## 2021-10-25 HISTORY — PX: ESOPHAGEAL MANOMETRY: SHX5429

## 2021-10-25 SURGERY — MANOMETRY, ESOPHAGUS

## 2021-10-25 MED ORDER — LIDOCAINE VISCOUS HCL 2 % MT SOLN
OROMUCOSAL | Status: AC
  Filled 2021-10-25: qty 15

## 2021-10-25 SURGICAL SUPPLY — 2 items
FACESHIELD LNG OPTICON STERILE (SAFETY) IMPLANT
GLOVE BIO SURGEON STRL SZ8 (GLOVE) ×4 IMPLANT

## 2021-10-25 NOTE — Progress Notes (Signed)
Esophageal manometry performed per protocol without complications.  Patient tolerated well. 

## 2021-10-28 ENCOUNTER — Encounter (HOSPITAL_COMMUNITY): Payer: Self-pay | Admitting: Gastroenterology

## 2021-11-08 ENCOUNTER — Telehealth: Payer: Self-pay

## 2021-11-08 NOTE — Telephone Encounter (Signed)
Called to let the patient know the study results are back. No available yet in My Chart, but should be sometime next week. A good study show normal relaxation of the EG junction and no significant peristaltic abnormality detected. Keep the appointment he is scheduled for on 11/28/21.

## 2021-11-26 DIAGNOSIS — K219 Gastro-esophageal reflux disease without esophagitis: Secondary | ICD-10-CM

## 2021-11-26 DIAGNOSIS — R1319 Other dysphagia: Secondary | ICD-10-CM

## 2021-11-28 ENCOUNTER — Ambulatory Visit: Payer: Medicare Other | Admitting: Gastroenterology

## 2021-11-28 ENCOUNTER — Encounter: Payer: Self-pay | Admitting: Gastroenterology

## 2021-11-28 VITALS — BP 130/80 | HR 71 | Ht 67.0 in | Wt 234.0 lb

## 2021-11-28 DIAGNOSIS — K219 Gastro-esophageal reflux disease without esophagitis: Secondary | ICD-10-CM | POA: Diagnosis not present

## 2021-11-28 DIAGNOSIS — K259 Gastric ulcer, unspecified as acute or chronic, without hemorrhage or perforation: Secondary | ICD-10-CM | POA: Diagnosis not present

## 2021-11-28 DIAGNOSIS — R1319 Other dysphagia: Secondary | ICD-10-CM | POA: Diagnosis not present

## 2021-11-28 DIAGNOSIS — K449 Diaphragmatic hernia without obstruction or gangrene: Secondary | ICD-10-CM

## 2021-11-28 MED ORDER — PANTOPRAZOLE SODIUM 40 MG PO TBEC: 40.0000 mg | DELAYED_RELEASE_TABLET | Freq: Every day | ORAL | 3 refills | Status: DC

## 2021-11-28 MED ORDER — SUCRALFATE 1 GM/10ML PO SUSP: 1.0000 g | Freq: Two times a day (BID) | ORAL | 1 refills | Status: DC

## 2021-11-28 NOTE — Progress Notes (Signed)
? ?       ? ?Stephen Nash    [id number]    01-17-1956 ? ?Primary Care Physician:Poe, Redmond Baseman, FNP ? ?Referring Physician: No referring provider defined for this encounter. ? ? ?Chief complaint:  GERD, Hiatal Hernia ? ?HPI: ? ?66 year old very pleasant gentleman here for follow up for GERD ? ?Overall symptoms are improved on daily Protonix and he is using Carafate at bedtime as needed.  Denies any heartburn, dysphagia or atypical chest pain.  He continues to have intermittent episodes of regurgitation.  He has a 5 cm hiatal hernia and is planning to undergo hernia repair surgery later this year. ? ?EGD October 02, 2021: Hiatal hernia, mild gastritis otherwise unremarkable exam ? ?Esophageal manometry October 25, 2021: No significant abnormality or motility disorder ? ?Colonoscopy October 02, 2021: Diverticulosis and hemorrhoids otherwise unremarkable ?  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 ?He is followed by cardiology in [facility], he has had work-up for syncope with negative Zio patch.  History of CAD s/p left heart cath and drug-eluting stent to LAD in 2017.  He is on low-dose diuretics.  He also has stable right bundle branch block and chronic hypertension ?He has obstructive sleep apnea ?  ? ? ?Outpatient Encounter Medications as of 11/28/2021  ?Medication Sig  ? allopurinol (ZYLOPRIM) 300 MG tablet Take by mouth.  ? aspirin 81 MG EC tablet Take by mouth.  ? atorvastatin (LIPITOR) 80 MG tablet Take by mouth.  ? hydrOXYzine (VISTARIL) 50 MG capsule Take by mouth.  ? losartan (COZAAR) 100 MG tablet Take by mouth.  ? pantoprazole (PROTONIX) 40 MG tablet Take 1 tablet (40 mg total) by mouth daily.  ? potassium chloride (KLOR-CON) 10 MEQ tablet Take by mouth.  ? Simethicone 180 MG CAPS Take 1 mg by mouth daily.  ? sucralfate (CARAFATE) 1 GM/10ML suspension Take 10 mLs (1 g total) by mouth 2 (two) times daily. Take 1 gm twice daily for 2 weeks  ? torsemide  (DEMADEX) 10 MG tablet Take 1 tablet by mouth daily.  ? [DISCONTINUED] albuterol (ACCUNEB) 1.25 MG/3ML nebulizer solution Inhale into the lungs.  ? [DISCONTINUED] apixaban (ELIQUIS) 5 MG TABS tablet Take by mouth. (Patient not taking: Reported on 11/28/2021)  ? [DISCONTINUED] dicyclomine (BENTYL) 20 MG tablet Take 20 mg by mouth every 6 (six) hours. (Patient not taking: Reported on 11/28/2021)  ? ?No facility-administered encounter medications on file as of 11/28/2021.  ? ? ?Allergies as of 11/28/2021 - Review Complete 11/28/2021  ?Allergen Reaction Noted  ? Percocet [oxycodone-acetaminophen]  10/10/2019  ? ? ?Past Medical History:  ?Diagnosis Date  ? Elevated cholesterol   ? GERD (gastroesophageal reflux disease)   ? High blood pressure   ? History of blood clots   ? Kidney stone   ? Sleep apnea   ? ? ?Past Surgical History:  ?Procedure Laterality Date  ? CHOLECYSTECTOMY    ? coronary artery stent    ? ESOPHAGEAL MANOMETRY N/A 10/25/2021  ? Procedure: ESOPHAGEAL MANOMETRY (EM);  Surgeon: Mauri Pole, MD;  Location: [facility];  Service: Endoscopy;  Laterality: N/A;  ? HERNIA REPAIR    ? ? ?Family History  ?Problem Relation Age of Onset  ? Heart failure Mother   ? Stroke Father   ? Heart failure Father   ? Colon cancer Maternal Grandfather   ? Kidney cancer Other   ?     uncle  ? ? ?Social History  ? ?  Socioeconomic History  ? Marital status: Married  ?  Spouse name: Not on file  ? Number of children: 1  ? Years of education: Not on file  ? Highest education level: Not on file  ?Occupational History  ? Not on file  ?Tobacco Use  ? Smoking status: Never  ? Smokeless tobacco: Never  ?Vaping Use  ? Vaping Use: Never used  ?Substance and Sexual Activity  ? Alcohol use: Never  ? Drug use: Never  ? Sexual activity: Not on file  ?Other Topics Concern  ? Not on file  ?Social History Narrative  ? Not on file  ? ?Social Determinants of Health  ? ?Financial Resource Strain: Not on file  ?Food Insecurity: Not on file   ?Transportation Needs: Not on file  ?Physical Activity: Not on file  ?Stress: Not on file  ?Social Connections: Not on file  ?Intimate Partner Violence: Not on file  ? ? ? ? ?Review of systems: ?All other review of systems negative except as mentioned in the HPI. ? ? ?Physical Exam: ?Vitals:  ? 11/28/21 0828  ?BP: 130/80  ?Pulse: 71  ?SpO2: 98%  ? ?Body mass index is 36.65 kg/m?. ?Gen:      No acute distress ?HEENT:  sclera anicteric ?Neuro: alert and oriented x 3 ?Psych: normal mood and affect ? ?Data Reviewed: ? ?Reviewed labs, radiology imaging, old records and pertinent past GI work up ? ? ?Assessment and Plan/Recommendations: ? ?66 year old very pleasant gentleman with history of hypertension, hyperlipidemia, CAD, right bundle branch block, OSA and DVT on chronic Eliquis ? ?Dysphagia and atypical chest pain improved.  No significant esophageal dysmotility on esophageal manometry.   ?  ?GERD and hiatal hernia: Continue Protonix and antireflux measures ?Use Carafate as needed for dyspepsia symptoms ?Continue with lifestyle modifications ? ?Planning to undergo hiatal hernia repair surgery later this year, had consult visit with Dr. Rosendo Gros ? ?Return in 1 year ? ?This visit required >30 minutes of patient care (this includes precharting, chart review, review of results, face-to-face time used for counseling as well as treatment plan and follow-up. The patient was provided an opportunity to ask questions and all were answered. The patient agreed with the plan and demonstrated an understanding of the instructions. ? ?K. Denzil Magnuson , MD ?  ? ?CC: No ref. provider found ? ? ?

## 2021-11-28 NOTE — Patient Instructions (Addendum)
We have sent the following medications to your pharmacy for you to pick up at your convenience:  Pantoprazole  Carafate  ? ?Follow up  in 1 year ? ?If you are age 67 or older, your body mass index should be between 23-30. Your Body mass index is 36.65 kg/m?Marland Kitchen If this is out of the aforementioned range listed, please consider follow up with your Primary Care Provider. ? ?If you are age 3 or younger, your body mass index should be between 19-25. Your Body mass index is 36.65 kg/m?Marland Kitchen If this is out of the aformentioned range listed, please consider follow up with your Primary Care Provider.  ? ?________________________________________________________ ? ?The [facility] providers would like to encourage you to use [facility] to communicate with providers for non-urgent requests or questions.  Due to long hold times on the telephone, sending your provider a message by [facility] may be a faster and more efficient way to get a response.  Please allow 48 business hours for a response.  Please remember that this is for non-urgent requests.  ?_______________________________________________________  ? ?I appreciate the  opportunity to care for you ? ?Thank You  ? ?Harl Bowie , MD  ?

## 2022-02-05 ENCOUNTER — Other Ambulatory Visit: Payer: Self-pay | Admitting: Gastroenterology

## 2022-02-05 DIAGNOSIS — R1319 Other dysphagia: Secondary | ICD-10-CM

## 2022-02-05 DIAGNOSIS — K219 Gastro-esophageal reflux disease without esophagitis: Secondary | ICD-10-CM

## 2022-02-05 DIAGNOSIS — K259 Gastric ulcer, unspecified as acute or chronic, without hemorrhage or perforation: Secondary | ICD-10-CM

## 2022-02-21 ENCOUNTER — Emergency Department (HOSPITAL_COMMUNITY)
Admission: EM | Admit: 2022-02-21 | Discharge: 2022-02-21 | Disposition: A | Discharge status: Home / Self Care | Payer: Medicare Other | Attending: Emergency Medicine | Admitting: Emergency Medicine

## 2022-02-21 ENCOUNTER — Other Ambulatory Visit: Payer: Self-pay

## 2022-02-21 ENCOUNTER — Encounter (HOSPITAL_COMMUNITY): Payer: Self-pay

## 2022-02-21 ENCOUNTER — Emergency Department (HOSPITAL_COMMUNITY): Payer: Medicare Other

## 2022-02-21 DIAGNOSIS — N200 Calculus of kidney: Secondary | ICD-10-CM | POA: Insufficient documentation

## 2022-02-21 DIAGNOSIS — Z20822 Contact with and (suspected) exposure to covid-19: Secondary | ICD-10-CM | POA: Diagnosis not present

## 2022-02-21 DIAGNOSIS — Z7982 Long term (current) use of aspirin: Secondary | ICD-10-CM | POA: Diagnosis not present

## 2022-02-21 DIAGNOSIS — R109 Unspecified abdominal pain: Secondary | ICD-10-CM

## 2022-02-21 LAB — URINALYSIS, ROUTINE W REFLEX MICROSCOPIC
Bilirubin Urine: NEGATIVE
Glucose, UA: NEGATIVE mg/dL
Ketones, ur: NEGATIVE mg/dL
Leukocytes,Ua: NEGATIVE
Nitrite: NEGATIVE
Protein, ur: 30 mg/dL — AB
Specific Gravity, Urine: 1.018 (ref 1.005–1.030)
pH: 5 (ref 5.0–8.0)

## 2022-02-21 LAB — CBC WITH DIFFERENTIAL/PLATELET
Abs Immature Granulocytes: 0.08 10*3/uL — ABNORMAL HIGH (ref 0.00–0.07)
Basophils Absolute: 0 10*3/uL (ref 0.0–0.1)
Basophils Relative: 0 %
Eosinophils Absolute: 0 10*3/uL (ref 0.0–0.5)
Eosinophils Relative: 0 %
HCT: 45.3 % (ref 39.0–52.0)
Hemoglobin: 15.7 g/dL (ref 13.0–17.0)
Immature Granulocytes: 1 %
Lymphocytes Relative: 11 %
Lymphs Abs: 1.2 10*3/uL (ref 0.7–4.0)
MCH: 31.2 pg (ref 26.0–34.0)
MCHC: 34.7 g/dL (ref 30.0–36.0)
MCV: 89.9 fL (ref 80.0–100.0)
Monocytes Absolute: 0.9 10*3/uL (ref 0.1–1.0)
Monocytes Relative: 8 %
Neutro Abs: 8.8 10*3/uL — ABNORMAL HIGH (ref 1.7–7.7)
Neutrophils Relative %: 80 %
Platelets: 172 10*3/uL (ref 150–400)
RBC: 5.04 MIL/uL (ref 4.22–5.81)
RDW: 14.3 % (ref 11.5–15.5)
WBC: 11 10*3/uL — ABNORMAL HIGH (ref 4.0–10.5)
nRBC: 0 % (ref 0.0–0.2)

## 2022-02-21 LAB — BASIC METABOLIC PANEL
Anion gap: 9 (ref 5–15)
BUN: 22 mg/dL (ref 8–23)
CO2: 26 mmol/L (ref 22–32)
Calcium: 9 mg/dL (ref 8.9–10.3)
Chloride: 103 mmol/L (ref 98–111)
Creatinine, Ser: 1.31 mg/dL — ABNORMAL HIGH (ref 0.61–1.24)
GFR, Estimated: 60 mL/min — ABNORMAL LOW (ref >60)
Glucose, Bld: 138 mg/dL — ABNORMAL HIGH (ref 70–99)
Potassium: 3.9 mmol/L (ref 3.5–5.1)
Sodium: 138 mmol/L (ref 135–145)

## 2022-02-21 LAB — RESP PANEL BY RT-PCR (FLU A&B, COVID) ARPGX2
Influenza A by PCR: NEGATIVE
Influenza B by PCR: NEGATIVE
SARS Coronavirus 2 by RT PCR: NEGATIVE

## 2022-02-21 NOTE — ED Triage Notes (Signed)
Patient c/o right flank pain and N/v x 3 weeks. Patient states he went to his PCP on 02/08/22 and was told he may have a kidney stone. Patient reports a history of kidney stones. Patient also reports that he thinks he may have passed a kidney stone on 02/16/22.

## 2022-02-21 NOTE — Discharge Instructions (Addendum)
Watch for increased trouble breathing.  Watch for continued fevers.  Follow-up with your doctor as needed.  Flu and COVID testing has been done and you can follow the results on MyChart

## 2022-02-21 NOTE — ED Provider Notes (Signed)
[facility] DEPT Provider Note   CSN: [id number] Arrival date & time: 02/21/22  1510     History  Chief Complaint  Patient presents with   Emesis   Flank Pain    Stephen Nash is a 65 y.o. male.   Emesis Associated symptoms: chills   Flank Pain Patient presents with right flank pain.  Has had for around 3 weeks now.  Some nausea and vomiting.  States he was seen in urgent care and told it may be muscular pain.  Pain did not improve.  Then later went to PCP and had blood in the urine and thought he may have passed a kidney stone since he felt something come out in his urine.  Now has been having fevers and chills.  Pain goes from his right flank down to his testicles.  Does have some urinary frequency.  No diarrhea.  No constipation.  No coughing.  No difficulty breathing.  States the pain is worse in the right groin.     Home Medications Prior to Admission medications   Medication Sig Start Date End Date Taking? Authorizing Provider  allopurinol (ZYLOPRIM) 300 MG tablet Take by mouth.    [provider]  aspirin 81 MG EC tablet Take by mouth.    [provider]  atorvastatin (LIPITOR) 80 MG tablet Take by mouth. 04/18/16   [provider]  hydrOXYzine (VISTARIL) 50 MG capsule Take by mouth. 08/28/21   [provider]  losartan (COZAAR) 100 MG tablet Take by mouth. 12/19/20   [provider]  pantoprazole (PROTONIX) 40 MG tablet Take 1 tablet (40 mg total) by mouth daily. 11/28/21   Mauri Pole, MD  potassium chloride (KLOR-CON) 10 MEQ tablet Take by mouth. 05/22/21   [provider]  Simethicone 180 MG CAPS Take 1 mg by mouth daily.    [provider]  sucralfate (CARAFATE) 1 GM/10ML suspension TAKE 10ML BY MOUTH 2 TIMES A DAY 02/05/22   Nandigam, Venia Minks, MD  torsemide (DEMADEX) 10 MG tablet Take 1 tablet by mouth daily. 05/31/21   [provider]      Allergies    Percocet  [oxycodone-acetaminophen]    Review of Systems   Review of Systems  Constitutional:  Positive for chills. Negative for appetite change.  Gastrointestinal:  Positive for vomiting.  Genitourinary:  Positive for flank pain.   Physical Exam Updated Vital Signs BP 140/89 (BP Location: Left Arm)   Pulse (!) 107   Temp 99.5 F (37.5 C) (Oral)   Resp 20   Ht '5\' 6"'$  (1.676 m)   Wt 104.3 kg   SpO2 91%   BMI 37.12 kg/m  Physical Exam Vitals and nursing note reviewed.  Eyes:     Pupils: Pupils are equal, round, and reactive to light.  Cardiovascular:     Rate and Rhythm: Regular rhythm.  Abdominal:     Tenderness: There is abdominal tenderness.     Comments: Tenderness in right groin.  No hernia palpated.  Genitourinary:    Comments: No CVA tenderness.  Bilateral testicular tenderness.  No swelling. Musculoskeletal:        General: No tenderness.     Cervical back: Neck supple.  Skin:    Capillary Refill: Capillary refill takes less than 2 seconds.  Neurological:     Mental Status: He is alert and oriented to person, place, and time.    ED Results / Procedures / Treatments   Labs (all labs  ordered are listed, but only abnormal results are displayed) Labs Reviewed  URINALYSIS, ROUTINE W REFLEX MICROSCOPIC - Abnormal; Notable for the following components:      Result Value   APPearance HAZY (*)    Hgb urine dipstick MODERATE (*)    Protein, ur 30 (*)    Bacteria, UA RARE (*)    All other components within normal limits  CBC WITH DIFFERENTIAL/PLATELET - Abnormal; Notable for the following components:   WBC 11.0 (*)    Neutro Abs 8.8 (*)    Abs Immature Granulocytes 0.08 (*)    All other components within normal limits  BASIC METABOLIC PANEL - Abnormal; Notable for the following components:   Glucose, Bld 138 (*)    Creatinine, Ser 1.31 (*)    GFR, Estimated 60 (*)    All other components within normal limits  RESP PANEL BY RT-PCR (FLU A&B, COVID) [id number]     EKG None  Radiology CT Renal Stone Study  Result Date: 02/21/2022 CLINICAL DATA:  Right flank pain, nausea and vomiting for 3 weeks EXAM: CT ABDOMEN AND PELVIS WITHOUT CONTRAST TECHNIQUE: Multidetector CT imaging of the abdomen and pelvis was performed following the standard protocol without IV contrast. RADIATION DOSE REDUCTION: This exam was performed according to the departmental dose-optimization program which includes automated exposure control, adjustment of the mA and/or kV according to patient size and/or use of iterative reconstruction technique. COMPARISON:  05/05/2021 FINDINGS: Lower chest: No acute pleural or parenchymal lung disease. Hepatobiliary: Cholecystectomy. Unremarkable unenhanced appearance of the liver. Pancreas: Unremarkable unenhanced appearance. Spleen: Unremarkable unenhanced appearance. Adrenals/Urinary Tract: Stable nonobstructing 4 mm calculus within the lower pole left kidney. No right-sided calculi. No obstructive uropathy within either kidney. The adrenals and bladder are unremarkable. Stomach/Bowel: No bowel obstruction or ileus. Normal appendix right lower quadrant. Scattered diverticulosis of the distal colon without diverticulitis. No bowel wall thickening or inflammatory change. Vascular/Lymphatic: Aortic atherosclerosis. No enlarged abdominal or pelvic lymph nodes. Reproductive: Prostate is unremarkable. Other: No free fluid or free intraperitoneal gas. No abdominal wall hernia. Musculoskeletal: No acute or destructive bony lesions. Reconstructed images demonstrate no additional findings. IMPRESSION: 1. Stable nonobstructing 4 mm left renal calculus. 2. Minimal distal colonic diverticulosis without diverticulitis. 3.  Aortic Atherosclerosis (ICD10-I70.0). Electronically Signed   By: Randa Ngo M.D.   On: 02/21/2022 16:07   US SCROTUM W/DOPPLER  Result Date: 02/21/2022 CLINICAL DATA:  Testicular pain EXAM: SCROTAL ULTRASOUND DOPPLER ULTRASOUND OF THE  TESTICLES TECHNIQUE: Complete ultrasound examination of the testicles, epididymis, and other scrotal structures was performed. Color and spectral Doppler ultrasound were also utilized to evaluate blood flow to the testicles. COMPARISON:  None Available. FINDINGS: Right testicle Measurements: 4.4 x 2.9 x 2.7 cm. No mass or microlithiasis visualized. Left testicle Measurements: 4.7 x 2.1 x 2.6 cm. No mass or microlithiasis visualized. Right epididymis: Measures 2.2 x 2.06 x 2.4 cm with volume measuring 5.9 mL. There are multiple epididymal cysts seen at the head of the epididymis with the largest cyst measuring 2 x 1.7 x 2.3 cm Left epididymis:  Without increased blood flow. Hydrocele:  There is moderate right and mild left hydrocele seen. Varicocele:  There are dilated tubular structure seen at the Pulsed Doppler interrogation of both testes demonstrates normal low resistance arterial and venous waveforms bilaterally. IMPRESSION: Both testes have a normal appearance. Cysts at the heads of the bilateral epididymis, multiple at the right and single at the left. Moderate right and small left hydrocele. Questionable varicocele at the left  side. Electronically Signed   By: Frazier Richards M.D.   On: 02/21/2022 17:29    Procedures Procedures    Medications Ordered in ED Medications - No data to display  ED Course/ Medical Decision Making/ A&P                           Medical Decision Making Amount and/or Complexity of Data Reviewed Labs: ordered. Radiology: ordered.   Patient with flank pain.  Previous history of kidney stones.  Reportedly fever at home.  However urine does not show infection.  CT scan done and did show left side renal stone with no hydronephrosis.  Pain is however on the contralateral side.  Did have testicular tenderness.  Ultrasound done and reassuring.  No clear cause of the pain or fever.  Appears stable for discharge home.  Eager to go home.  Can follow-up with his doctors.  White  count mildly elevated.  Creatinine mildly above baseline.  Potentially could have previously passed a stone when he was having the pain however.  Slightly low normal oxygenation.  Lungs clear on exam.  Occasional cough.  CT scan that was done through the lower lungs did not show any pathology.  Appears stable for outpatient follow-up.  Discharge home.        Final Clinical Impression(s) / ED Diagnoses Final diagnoses:  Flank pain    Rx / DC Orders ED Discharge Orders     None         Davonna Belling, MD 02/21/22 2319

## 2022-02-24 ENCOUNTER — Inpatient Hospital Stay (HOSPITAL_COMMUNITY)
Admission: EM | Admit: 2022-02-24 | Discharge: 2022-02-28 | DRG: 871 | Disposition: A | Discharge status: Home / Self Care | Payer: Medicare Other | Attending: Internal Medicine | Admitting: Internal Medicine

## 2022-02-24 ENCOUNTER — Other Ambulatory Visit: Payer: Self-pay

## 2022-02-24 ENCOUNTER — Emergency Department (HOSPITAL_COMMUNITY): Payer: Medicare Other

## 2022-02-24 ENCOUNTER — Encounter (HOSPITAL_COMMUNITY): Payer: Self-pay

## 2022-02-24 DIAGNOSIS — I2510 Atherosclerotic heart disease of native coronary artery without angina pectoris: Secondary | ICD-10-CM | POA: Diagnosis not present

## 2022-02-24 DIAGNOSIS — K219 Gastro-esophageal reflux disease without esophagitis: Secondary | ICD-10-CM | POA: Diagnosis present

## 2022-02-24 DIAGNOSIS — R1319 Other dysphagia: Secondary | ICD-10-CM

## 2022-02-24 DIAGNOSIS — A419 Sepsis, unspecified organism: Secondary | ICD-10-CM | POA: Diagnosis not present

## 2022-02-24 DIAGNOSIS — Z7982 Long term (current) use of aspirin: Secondary | ICD-10-CM

## 2022-02-24 DIAGNOSIS — I248 Other forms of acute ischemic heart disease: Secondary | ICD-10-CM | POA: Diagnosis present

## 2022-02-24 DIAGNOSIS — Z20822 Contact with and (suspected) exposure to covid-19: Secondary | ICD-10-CM | POA: Diagnosis present

## 2022-02-24 DIAGNOSIS — R6520 Severe sepsis without septic shock: Secondary | ICD-10-CM | POA: Diagnosis present

## 2022-02-24 DIAGNOSIS — R7989 Other specified abnormal findings of blood chemistry: Secondary | ICD-10-CM | POA: Diagnosis present

## 2022-02-24 DIAGNOSIS — Z9861 Coronary angioplasty status: Secondary | ICD-10-CM

## 2022-02-24 DIAGNOSIS — R778 Other specified abnormalities of plasma proteins: Secondary | ICD-10-CM

## 2022-02-24 DIAGNOSIS — Z955 Presence of coronary angioplasty implant and graft: Secondary | ICD-10-CM

## 2022-02-24 DIAGNOSIS — N179 Acute kidney failure, unspecified: Secondary | ICD-10-CM | POA: Diagnosis not present

## 2022-02-24 DIAGNOSIS — K760 Fatty (change of) liver, not elsewhere classified: Secondary | ICD-10-CM | POA: Diagnosis present

## 2022-02-24 DIAGNOSIS — Z6835 Body mass index (BMI) 35.0-35.9, adult: Secondary | ICD-10-CM

## 2022-02-24 DIAGNOSIS — J189 Pneumonia, unspecified organism: Secondary | ICD-10-CM | POA: Diagnosis present

## 2022-02-24 DIAGNOSIS — E669 Obesity, unspecified: Secondary | ICD-10-CM | POA: Diagnosis present

## 2022-02-24 DIAGNOSIS — R109 Unspecified abdominal pain: Secondary | ICD-10-CM | POA: Diagnosis present

## 2022-02-24 DIAGNOSIS — R7401 Elevation of levels of liver transaminase levels: Secondary | ICD-10-CM | POA: Diagnosis present

## 2022-02-24 DIAGNOSIS — G4733 Obstructive sleep apnea (adult) (pediatric): Secondary | ICD-10-CM

## 2022-02-24 DIAGNOSIS — E876 Hypokalemia: Secondary | ICD-10-CM

## 2022-02-24 DIAGNOSIS — K259 Gastric ulcer, unspecified as acute or chronic, without hemorrhage or perforation: Secondary | ICD-10-CM

## 2022-02-24 DIAGNOSIS — Z79899 Other long term (current) drug therapy: Secondary | ICD-10-CM

## 2022-02-24 LAB — COMPREHENSIVE METABOLIC PANEL
ALT: 92 U/L — ABNORMAL HIGH (ref 0–44)
AST: 163 U/L — ABNORMAL HIGH (ref 15–41)
Albumin: 3.7 g/dL (ref 3.5–5.0)
Alkaline Phosphatase: 105 U/L (ref 38–126)
Anion gap: 13 (ref 5–15)
BUN: 40 mg/dL — ABNORMAL HIGH (ref 8–23)
CO2: 23 mmol/L (ref 22–32)
Calcium: 8.8 mg/dL — ABNORMAL LOW (ref 8.9–10.3)
Chloride: 95 mmol/L — ABNORMAL LOW (ref 98–111)
Creatinine, Ser: 2.09 mg/dL — ABNORMAL HIGH (ref 0.61–1.24)
GFR, Estimated: 34 mL/min — ABNORMAL LOW (ref >60)
Glucose, Bld: 133 mg/dL — ABNORMAL HIGH (ref 70–99)
Potassium: 3.1 mmol/L — ABNORMAL LOW (ref 3.5–5.1)
Sodium: 131 mmol/L — ABNORMAL LOW (ref 135–145)
Total Bilirubin: 1.6 mg/dL — ABNORMAL HIGH (ref 0.3–1.2)
Total Protein: 8.4 g/dL — ABNORMAL HIGH (ref 6.5–8.1)

## 2022-02-24 LAB — CBC WITH DIFFERENTIAL/PLATELET
Abs Immature Granulocytes: 0.1 10*3/uL — ABNORMAL HIGH (ref 0.00–0.07)
Basophils Absolute: 0 10*3/uL (ref 0.0–0.1)
Basophils Relative: 0 %
Eosinophils Absolute: 0 10*3/uL (ref 0.0–0.5)
Eosinophils Relative: 0 %
HCT: 48.3 % (ref 39.0–52.0)
Hemoglobin: 17.3 g/dL — ABNORMAL HIGH (ref 13.0–17.0)
Immature Granulocytes: 1 %
Lymphocytes Relative: 10 %
Lymphs Abs: 1.4 10*3/uL (ref 0.7–4.0)
MCH: 31.3 pg (ref 26.0–34.0)
MCHC: 35.8 g/dL (ref 30.0–36.0)
MCV: 87.3 fL (ref 80.0–100.0)
Monocytes Absolute: 0.6 10*3/uL (ref 0.1–1.0)
Monocytes Relative: 4 %
Neutro Abs: 11.5 10*3/uL — ABNORMAL HIGH (ref 1.7–7.7)
Neutrophils Relative %: 85 %
Platelets: 181 10*3/uL (ref 150–400)
RBC: 5.53 MIL/uL (ref 4.22–5.81)
RDW: 14.5 % (ref 11.5–15.5)
WBC: 13.6 10*3/uL — ABNORMAL HIGH (ref 4.0–10.5)
nRBC: 0 % (ref 0.0–0.2)

## 2022-02-24 LAB — TROPONIN I (HIGH SENSITIVITY)
Troponin I (High Sensitivity): 93 ng/L — ABNORMAL HIGH (ref <18)
Troponin I (High Sensitivity): 90 ng/L — ABNORMAL HIGH (ref <18)

## 2022-02-24 LAB — ABO/RH: ABO/RH(D): B POS

## 2022-02-24 LAB — LIPASE, BLOOD: Lipase: 102 U/L — ABNORMAL HIGH (ref 11–51)

## 2022-02-24 LAB — CBG MONITORING, ED: Glucose-Capillary: 126 mg/dL — ABNORMAL HIGH (ref 70–99)

## 2022-02-24 LAB — RESP PANEL BY RT-PCR (FLU A&B, COVID) ARPGX2
Influenza A by PCR: NEGATIVE
Influenza B by PCR: NEGATIVE
SARS Coronavirus 2 by RT PCR: NEGATIVE

## 2022-02-24 LAB — TYPE AND SCREEN
ABO/RH(D): B POS
Antibody Screen: NEGATIVE

## 2022-02-24 LAB — LACTIC ACID, PLASMA
Lactic Acid, Venous: 2.2 mmol/L (ref 0.5–1.9)
Lactic Acid, Venous: 1.5 mmol/L (ref 0.5–1.9)

## 2022-02-24 LAB — PROTIME-INR
INR: 1.1 (ref 0.8–1.2)
Prothrombin Time: 14 seconds (ref 11.4–15.2)

## 2022-02-24 LAB — BRAIN NATRIURETIC PEPTIDE: B Natriuretic Peptide: 47 pg/mL (ref 0.0–100.0)

## 2022-02-24 MED ORDER — ONDANSETRON HCL 4 MG/2ML IJ SOLN
4.0000 mg | Freq: Once | INTRAMUSCULAR | Status: AC
  Administered 2022-02-24: 4 mg via INTRAVENOUS
  Filled 2022-02-24: qty 2

## 2022-02-24 MED ORDER — ASPIRIN 81 MG PO TBEC
81.0000 mg | DELAYED_RELEASE_TABLET | Freq: Every day | ORAL | Status: DC
  Administered 2022-02-25 – 2022-02-28 (×4): 81 mg via ORAL
  Filled 2022-02-24 (×4): qty 1

## 2022-02-24 MED ORDER — PANTOPRAZOLE SODIUM 40 MG PO TBEC
40.0000 mg | DELAYED_RELEASE_TABLET | Freq: Two times a day (BID) | ORAL | Status: DC
  Administered 2022-02-25 – 2022-02-28 (×7): 40 mg via ORAL
  Filled 2022-02-24 (×7): qty 1

## 2022-02-24 MED ORDER — SUCRALFATE 1 GM/10ML PO SUSP
1.0000 g | Freq: Two times a day (BID) | ORAL | Status: DC | PRN
Start: 2022-02-24 — End: 2022-02-28
  Administered 2022-02-28: 1 g via ORAL
  Filled 2022-02-24: qty 10

## 2022-02-24 MED ORDER — SODIUM CHLORIDE 0.9 % IV SOLN
500.0000 mg | Freq: Once | INTRAVENOUS | Status: AC
  Administered 2022-02-24: 500 mg via INTRAVENOUS
  Filled 2022-02-24: qty 5

## 2022-02-24 MED ORDER — LACTATED RINGERS IV SOLN: INTRAVENOUS | Status: DC

## 2022-02-24 MED ORDER — ALLOPURINOL 300 MG PO TABS
300.0000 mg | ORAL_TABLET | Freq: Every day | ORAL | Status: DC
  Administered 2022-02-25 – 2022-02-28 (×4): 300 mg via ORAL
  Filled 2022-02-24 (×4): qty 1

## 2022-02-24 MED ORDER — FENTANYL CITRATE PF 50 MCG/ML IJ SOSY
50.0000 ug | PREFILLED_SYRINGE | Freq: Once | INTRAMUSCULAR | Status: AC
  Administered 2022-02-24: 50 ug via INTRAVENOUS
  Filled 2022-02-24: qty 1

## 2022-02-24 MED ORDER — POTASSIUM CHLORIDE 10 MEQ/100ML IV SOLN
10.0000 meq | INTRAVENOUS | Status: AC
  Administered 2022-02-24 – 2022-02-25 (×4): 10 meq via INTRAVENOUS
  Filled 2022-02-24 (×3): qty 100

## 2022-02-24 MED ORDER — SODIUM CHLORIDE 0.9 % IV SOLN
2.0000 g | INTRAVENOUS | Status: DC
  Administered 2022-02-25 – 2022-02-27 (×3): 2 g via INTRAVENOUS
  Filled 2022-02-24 (×3): qty 20

## 2022-02-24 MED ORDER — ENOXAPARIN SODIUM 40 MG/0.4ML IJ SOSY
40.0000 mg | PREFILLED_SYRINGE | INTRAMUSCULAR | Status: DC
Start: 2022-02-24 — End: 2022-02-28
  Administered 2022-02-24 – 2022-02-27 (×4): 40 mg via SUBCUTANEOUS
  Filled 2022-02-24 (×4): qty 0.4

## 2022-02-24 MED ORDER — SODIUM CHLORIDE 0.9 % IV BOLUS
1000.0000 mL | Freq: Once | INTRAVENOUS | Status: AC
  Administered 2022-02-24: 1000 mL via INTRAVENOUS

## 2022-02-24 MED ORDER — SODIUM CHLORIDE 0.9 % IV SOLN
500.0000 mg | INTRAVENOUS | Status: DC
  Administered 2022-02-25 – 2022-02-27 (×3): 500 mg via INTRAVENOUS
  Filled 2022-02-24 (×6): qty 5

## 2022-02-24 MED ORDER — SODIUM CHLORIDE 0.9 % IV SOLN
1.0000 g | Freq: Once | INTRAVENOUS | Status: AC
  Administered 2022-02-24: 1 g via INTRAVENOUS
  Filled 2022-02-24: qty 10

## 2022-02-24 MED ORDER — ATORVASTATIN CALCIUM 40 MG PO TABS
80.0000 mg | ORAL_TABLET | Freq: Every evening | ORAL | Status: DC
  Administered 2022-02-24: 80 mg via ORAL
  Filled 2022-02-24: qty 2

## 2022-02-24 NOTE — Assessment & Plan Note (Addendum)
R sided flank pain ongoing for a couple of weeks. Seen in ED for this 3 days ago. CT AP neg, testicular US neg. H/o cholecystectomy. 1. As per AKI above, pt on ABx 2. Consider repeat imaging if AKI not improving on repeat labs.

## 2022-02-24 NOTE — Assessment & Plan Note (Addendum)
Cont ASA + statin Doubt acute type 1 MI today, more likely demand ischemia (see elevated troponin above)

## 2022-02-24 NOTE — ED Notes (Signed)
Opened chart at pts request to check Covid results, his password is not working for my chart.

## 2022-02-24 NOTE — Assessment & Plan Note (Addendum)
H/o cholecystectomy No abnormality detected on CT x3 days ago. ? Shock liver: pt also has troponin leak, AKI, sepsis 1. Repeat CMP ordered for AM 2. If not improving, may wish to get RUQ [place]

## 2022-02-24 NOTE — Assessment & Plan Note (Addendum)
LLL PNA with sepsis 1. PNA pathway 2. Rocephin + azithro 3. covid and flu are neg 4. Check RVP 5. Check urine legionella and pneumococcus antigens 6. BCx pending

## 2022-02-24 NOTE — Assessment & Plan Note (Addendum)
First trop of 93. 1. Likely demand ischemia in setting of sepsis, PNA, and initial tachycardia on presentation to ED of 150! 2. Second trop pending 3. EKG shows S1QT3; however, this was present on prior EKG back in Jan of 2021. 4. EKG grossly unchanged (other than S.Tach today). 5. No chest pain 6. Type 1 MI overall seems less likely.

## 2022-02-24 NOTE — ED Provider Triage Note (Signed)
Emergency Medicine Provider Triage Evaluation Note  Stephen Nash , a 66 y.o. male  was evaluated in triage.  Pt complains of shortness of breath.  Patient was seen in the emergency department 3 days ago for right-sided lower abdominal pain which has improved.  He was evaluated with CT of the abdomen and pelvis to evaluate for kidney stone and a scrotal ultrasound, both which were overall unremarkable.  After returning home, patient developed worsening shortness of breath and trouble breathing.  He has been very weak with decreased appetite, only wanting to drink water.  He has been disoriented at times per his wife who is at bedside.  Temperature has been around 100 F.  Patient denies chest pain.  He does have a stent in his heart.  He is on a fluid pill but family denies history of heart failure or COPD.  He does have a CPAP machine.  Review of Systems  Positive: Shortness of breath, productive cough Negative: Chest pain  Physical Exam  There were no vitals taken for this visit. Gen:   Awake, appears mildly uncomfortable Resp:  Mild tachypnea, shallow breathing MSK:   Moves extremities without difficulty  Other:  Heart sounds irregular, decreased  Medical Decision Making  Medically screening exam initiated at 5:57 PM.  Appropriate orders placed.  Chinonso Linker was informed that the remainder of the evaluation will be completed by another provider, this initial triage assessment does not replace that evaluation, and the importance of remaining in the ED until their evaluation is complete.     Carlisle Cater, PA-C 02/24/22 1800

## 2022-02-24 NOTE — ED Triage Notes (Signed)
Patient c/o SOB, weakness, fever, headache,and a productive  cough with white sputum.

## 2022-02-24 NOTE — H&P (Signed)
History and Physical    Patient: Stephen Nash PIR:[id number] DOB: 1955-11-04 DOA: 02/24/2022 DOS: the patient was seen and examined on 02/24/2022 PCP: Madison Hickman, FNP  Patient coming from: Home  Chief Complaint:  Chief Complaint  Patient presents with   Weakness   Cough   Shortness of Breath   HPI: Stephen Nash is a 66 y.o. male with medical history significant of CAD s/p stent, cholecystectomy.  Pt ill for about 3 weeks with cough, flank pain, testicular pain.  Seen in ED 3 days ago with R flank pain and testicular pain.  He had a CT renal and testicular ultrasound.  No clear diagnosis identified.  He said he since has been home he has felt progressively weak all over and with increased shortness of breath.  He denies any chest pain.  He does endorse a headache.  Decreased appetite although drinking a lot of fluids.  Urinating frequently although wife says that is normal for him.  Had a low-grade temperature.  In ED today: Pt initially tachy to 150, improved with IVF.  Found to have LLL PNA on CXR.   Review of Systems: As mentioned in the history of present illness. All other systems reviewed and are negative. Past Medical History:  Diagnosis Date   Elevated cholesterol    GERD (gastroesophageal reflux disease)    High blood pressure    History of blood clots    Kidney stone    Sleep apnea    Past Surgical History:  Procedure Laterality Date   CHOLECYSTECTOMY     coronary artery stent     ESOPHAGEAL MANOMETRY N/A 10/25/2021   Procedure: ESOPHAGEAL MANOMETRY (EM);  Surgeon: Mauri Pole, MD;  Location: [facility];  Service: Endoscopy;  Laterality: N/A;   HERNIA REPAIR     Social History:  reports that he has never smoked. He has never used smokeless tobacco. He reports that he does not drink alcohol and does not use drugs.  Allergies  Allergen Reactions   Percocet [Oxycodone-Acetaminophen]     Family History  Problem Relation Age of Onset   Heart failure  Mother    Stroke Father    Heart failure Father    Colon cancer Maternal Grandfather    Kidney cancer Other        uncle    Prior to Admission medications   Medication Sig Start Date End Date Taking? Authorizing Provider  allopurinol (ZYLOPRIM) 300 MG tablet Take 300 mg by mouth daily.   Yes [provider]  ammonium lactate (LAC-HYDRIN) 12 % lotion Apply 1 application. topically daily as needed for dry skin. 11/29/21  Yes [provider]  aspirin 81 MG EC tablet Take 81 mg by mouth daily.   Yes [provider]  atorvastatin (LIPITOR) 80 MG tablet Take 80 mg by mouth every evening. 04/18/16  Yes [provider]  cyclobenzaprine (FLEXERIL) 5 MG tablet Take 5 mg by mouth 3 (three) times daily as needed for muscle spasms. 02/08/22  Yes [provider]  losartan (COZAAR) 100 MG tablet Take 100 mg by mouth every evening. 12/19/20  Yes [provider]  pantoprazole (PROTONIX) 40 MG tablet Take 1 tablet (40 mg total) by mouth daily. Patient taking differently: Take 40 mg by mouth 2 (two) times daily. 11/28/21  Yes Nandigam, Venia Minks, MD  potassium chloride (KLOR-CON) 10 MEQ tablet Take 10 mEq by mouth daily. 01/27/22  Yes [provider]  sucralfate (CARAFATE) 1 GM/10ML suspension TAKE 10ML BY  MOUTH 2 TIMES A DAY Patient taking differently: Take 10 g by mouth 2 (two) times daily as needed (ulcers). 02/05/22  Yes Nandigam, Venia Minks, MD  torsemide (DEMADEX) 10 MG tablet Take 5 mg by mouth daily. 05/31/21  Yes [provider]    Physical Exam: Vitals:   02/24/22 1900 02/24/22 1915 02/24/22 1940 02/24/22 2030  BP:  119/74 (!) 147/78 120/90  Pulse: (!) 119 (!) 123  (!) 123  Resp: 15 (!) 21 20 (!) 37  Temp:    97.8 F (36.6 C)  TempSrc:      SpO2: 100% 100%  98%  Weight:      Height:       Constitutional: ill appearing Eyes: PERRL, lids and conjunctivae normal ENMT: Mucous membranes are moist. Posterior pharynx clear of any  exudate or lesions.Normal dentition.  Neck: normal, supple, no masses, no thyromegaly Respiratory: Tachypnic with accessory muscle usage Cardiovascular: Tachycardic Abdomen: no tenderness, no masses palpated. No hepatosplenomegaly. Bowel sounds positive.  Musculoskeletal: no clubbing / cyanosis. No joint deformity upper and lower extremities. Good ROM, no contractures. Normal muscle tone.  Skin: no rashes, lesions, ulcers. No induration Neurologic: CN 2-12 grossly intact. Sensation intact, DTR normal. Strength 5/5 in all 4.  Psychiatric: Normal judgment and insight. Alert and oriented x 3. Normal mood.   Data Reviewed:       Latest Ref Rng & Units 02/24/2022    6:48 PM 02/21/2022    4:24 PM 10/10/2019    3:59 PM  CBC  WBC 4.0 - 10.5 K/uL 13.6   11.0   13.0    Hemoglobin 13.0 - 17.0 g/dL 17.3   15.7   17.9    Hematocrit 39.0 - 52.0 % 48.3   45.3   54.3    Platelets 150 - 400 K/uL 181   172   234        Latest Ref Rng & Units 02/24/2022    6:48 PM 02/21/2022    4:24 PM 10/10/2019    3:59 PM  CMP  Glucose 70 - 99 mg/dL 133   138   99    BUN 8 - 23 mg/dL 40   22   19    Creatinine 0.61 - 1.24 mg/dL 2.09   1.31   1.02    Sodium 135 - 145 mmol/L 131   138   139    Potassium 3.5 - 5.1 mmol/L 3.1   3.9   3.9    Chloride 98 - 111 mmol/L 95   103   103    CO2 22 - 32 mmol/L '23   26   25    '$ Calcium 8.9 - 10.3 mg/dL 8.8   9.0   9.4    Total Protein 6.5 - 8.1 g/dL 8.4      Total Bilirubin 0.3 - 1.2 mg/dL 1.6      Alkaline Phos 38 - 126 U/L 105      AST 15 - 41 U/L 163      ALT 0 - 44 U/L 92       CXR : LLL PNA  Trop 93  Lactate 2.2   Assessment and Plan: * CAP (community acquired pneumonia) LLL PNA with sepsis PNA pathway Rocephin + azithro covid and flu are neg Check RVP Check urine legionella and pneumococcus antigens BCx pending  Sepsis [facility]) Patient meets criteria for sepsis at time of admission. Specifically the patient has at least 2 out of 4 SIRS criteria, namely:  Tachycardia, leukocytosis The currently suspected source of infection is Pneumonia of left lower lobe, demonstrated on CXR Lactic acid: 2.2 Blood pressure: 103/67, improved to 474 systolic after IVF IVF: 1L bolus and 125 cc/hr LR Antibiotics: Rocephin + azithro Cultures pending  AKI (acute kidney injury) ([place]) Likely pre-renal / ATN in setting of sepsis, PNA IVF Hold ARB and diuretics Strict intake and output No obstruction on CT AP x3 days ago (was already having some degree of AKI at that time). But may want to consider repeating imaging if pt not improving  Transaminitis H/o cholecystectomy No abnormality detected on CT x3 days ago. ? Shock liver: pt also has troponin leak, AKI, sepsis Repeat CMP ordered for AM If not improving, may wish to get RUQ [place]  Flank pain R sided flank pain ongoing for a couple of weeks. Seen in ED for this 3 days ago. CT AP neg, testicular US neg. H/o cholecystectomy. As per AKI above, pt on ABx Consider repeat imaging if AKI not improving on repeat labs.  Elevated troponin First trop of 93. Likely demand ischemia in setting of sepsis, PNA, and initial tachycardia on presentation to ED of 150! Second trop pending EKG shows [phone]; however, this was present on prior EKG back in Jan of 2021. EKG grossly unchanged (other than S.Tach today). No chest pain Type 1 MI overall seems less likely.  CAD S/P percutaneous coronary angioplasty Cont ASA + statin Doubt acute type 1 MI today, more likely demand ischemia (see elevated troponin above)      Advance Care Planning:   Code Status: Full Code  Consults: None  Family Communication: Wife at bedside  Severity of Illness: The appropriate patient status for this patient is OBSERVATION. Observation status is judged to be reasonable and necessary in order to provide the required intensity of service to ensure the patient's safety. The patient's presenting symptoms, physical exam findings, and initial  radiographic and laboratory data in the context of their medical condition is felt to place them at decreased risk for further clinical deterioration. Furthermore, it is anticipated that the patient will be medically stable for discharge from the hospital within 2 midnights of admission.   Author: Etta Quill., DO 02/24/2022 9:13 PM  For on call review [email or web address].

## 2022-02-24 NOTE — Assessment & Plan Note (Addendum)
Likely pre-renal / ATN in setting of sepsis, PNA 1. IVF 2. Hold ARB and diuretics 3. Strict intake and output 4. No obstruction on CT AP x3 days ago (was already having some degree of AKI at that time). 1. But may want to consider repeating imaging if pt not improving

## 2022-02-24 NOTE — Assessment & Plan Note (Addendum)
Patient meets criteria for sepsis at time of admission. Specifically the patient has at least 2 out of 4 SIRS criteria, namely: Tachycardia, leukocytosis The currently suspected source of infection is Pneumonia of left lower lobe, demonstrated on CXR Lactic acid: 2.2 Blood pressure: 103/67, improved to 395 systolic after IVF IVF: 1L bolus and 125 cc/hr LR Antibiotics: Rocephin + azithro Cultures pending

## 2022-02-24 NOTE — ED Provider Notes (Signed)
[facility] DEPT Provider Note   CSN: [id number] Arrival date & time: 02/24/22  1740     History {Add pertinent medical, surgical, social history, OB history to HPI:1} Chief Complaint  Patient presents with   Weakness   Cough   Shortness of Breath    Stephen Nash is a 66 y.o. male.  He was here 3 days ago with some right flank pain and testicular pain.  He had a CT renal and testicular ultrasound.  No clear diagnosis identified.  He said he since has been home he has felt progressively weak all over and with increased shortness of breath.  He denies any chest pain.  No fevers or chills.  He does endorse a headache.  Decreased appetite although drinking a lot of fluids.  Urinating frequently although wife says that is normal for him.  Had a low-grade temperature.  The history is provided by the patient and the spouse.  Weakness Severity:  Severe Onset quality:  Gradual Duration:  3 days Timing:  Constant Progression:  Worsening Chronicity:  New Relieved by:  Nothing Worsened by:  Nothing Ineffective treatments:  Drinking fluids Associated symptoms: cough, fever, frequency, headaches, nausea and shortness of breath   Associated symptoms: no abdominal pain, no aphasia, no chest pain, no dysuria, no loss of consciousness and no vomiting   Cough Associated symptoms: fever, headaches and shortness of breath   Associated symptoms: no chest pain, no rash and no sore throat   Shortness of Breath Associated symptoms: cough, fever and headaches   Associated symptoms: no abdominal pain, no chest pain, no rash, no sore throat and no vomiting       Home Medications Prior to Admission medications   Medication Sig Start Date End Date Taking? Authorizing Provider  allopurinol (ZYLOPRIM) 300 MG tablet Take by mouth.    [provider]  aspirin 81 MG EC tablet Take by mouth.    [provider]  atorvastatin (LIPITOR) 80 MG tablet Take by mouth.  04/18/16   [provider]  hydrOXYzine (VISTARIL) 50 MG capsule Take by mouth. 08/28/21   [provider]  losartan (COZAAR) 100 MG tablet Take by mouth. 12/19/20   [provider]  pantoprazole (PROTONIX) 40 MG tablet Take 1 tablet (40 mg total) by mouth daily. 11/28/21   Mauri Pole, MD  potassium chloride (KLOR-CON) 10 MEQ tablet Take by mouth. 05/22/21   [provider]  Simethicone 180 MG CAPS Take 1 mg by mouth daily.    [provider]  sucralfate (CARAFATE) 1 GM/10ML suspension TAKE 10ML BY MOUTH 2 TIMES A DAY 02/05/22   Nandigam, Venia Minks, MD  torsemide (DEMADEX) 10 MG tablet Take 1 tablet by mouth daily. 05/31/21   [provider]      Allergies    Percocet [oxycodone-acetaminophen]    Review of Systems   Review of Systems  Constitutional:  Positive for fatigue and fever.  HENT:  Negative for sore throat.   Eyes:  Negative for visual disturbance.  Respiratory:  Positive for cough and shortness of breath.   Cardiovascular:  Negative for chest pain.  Gastrointestinal:  Positive for nausea. Negative for abdominal pain and vomiting.  Genitourinary:  Positive for frequency. Negative for dysuria.  Musculoskeletal:  Positive for back pain.  Skin:  Negative for rash.  Neurological:  Positive for weakness and headaches. Negative for loss of consciousness.   Physical Exam Updated Vital Signs BP 103/67 (BP Location: Right Arm)  Pulse (!) 120   Temp 97.6 F (36.4 C) (Oral)   Ht '5\' 6"'$  (1.676 m)   Wt 104.3 kg   SpO2 94%   BMI 37.12 kg/m  Physical Exam Vitals and nursing note reviewed.  Constitutional:      General: He is not in acute distress.    Appearance: He is well-developed.  HENT:     Head: Normocephalic and atraumatic.  Eyes:     Conjunctiva/sclera: Conjunctivae normal.  Cardiovascular:     Rate and Rhythm: Regular rhythm. Tachycardia present.     Heart sounds: No murmur heard. Pulmonary:     Effort:  Tachypnea and accessory muscle usage present. No respiratory distress.     Breath sounds: Normal breath sounds.  Abdominal:     Palpations: Abdomen is soft.     Tenderness: There is no abdominal tenderness.  Musculoskeletal:        General: No swelling.     Cervical back: Neck supple.     Right lower leg: No tenderness. No edema.     Left lower leg: No tenderness. No edema.  Skin:    General: Skin is warm and dry.     Capillary Refill: Capillary refill takes less than 2 seconds.  Neurological:     General: No focal deficit present.     Mental Status: He is alert.    ED Results / Procedures / Treatments   Labs (all labs ordered are listed, but only abnormal results are displayed) Labs Reviewed  RESP PANEL BY RT-PCR (FLU A&B, COVID) ARPGX2  CULTURE, BLOOD (ROUTINE X 2)  CULTURE, BLOOD (ROUTINE X 2)  CBC WITH DIFFERENTIAL/PLATELET  COMPREHENSIVE METABOLIC PANEL  LACTIC ACID, PLASMA  LACTIC ACID, PLASMA  BRAIN NATRIURETIC PEPTIDE  LIPASE, BLOOD  URINALYSIS, ROUTINE W REFLEX MICROSCOPIC  PROTIME-INR  I-STAT VENOUS BLOOD GAS, ED  CBG MONITORING, ED  TYPE AND SCREEN  TROPONIN I (HIGH SENSITIVITY)    EKG None  Radiology No results found.  Procedures Procedures  {Document cardiac monitor, telemetry assessment procedure when appropriate:1}  Medications Ordered in ED Medications - No data to display  ED Course/ Medical Decision Making/ A&P                           Medical Decision Making Amount and/or Complexity of Data Reviewed Labs: ordered. Radiology: ordered.  This patient complains of ***; this involves an extensive number of treatment Options and is a complaint that carries with it a high risk of complications and morbidity. The differential includes ***  I ordered, reviewed and interpreted labs, which included *** I ordered medication *** and reviewed PMP when indicated. I ordered imaging studies which included *** and I independently    visualized and  interpreted imaging which showed *** Additional history obtained from *** Previous records obtained and reviewed *** I consulted *** and discussed lab and imaging findings and discussed disposition.  Cardiac monitoring reviewed, *** Social determinants considered, *** Critical Interventions: ***  After the interventions stated above, I reevaluated the patient and found *** Admission and further testing considered, ***    {Document critical care time when appropriate:1} {Document review of labs and clinical decision tools ie heart score, Chads2Vasc2 etc:1}  {Document your independent review of radiology images, and any outside records:1} {Document your discussion with family members, caretakers, and with consultants:1} {Document social determinants of health affecting pt's care:1} {Document your decision making why or why not admission, treatments were needed:1} Final  Clinical Impression(s) / ED Diagnoses Final diagnoses:  None    Rx / DC Orders ED Discharge Orders     None

## 2022-02-25 ENCOUNTER — Inpatient Hospital Stay (HOSPITAL_COMMUNITY): Payer: Medicare Other

## 2022-02-25 DIAGNOSIS — Z6835 Body mass index (BMI) 35.0-35.9, adult: Secondary | ICD-10-CM | POA: Diagnosis not present

## 2022-02-25 DIAGNOSIS — A419 Sepsis, unspecified organism: Secondary | ICD-10-CM | POA: Diagnosis present

## 2022-02-25 DIAGNOSIS — E669 Obesity, unspecified: Secondary | ICD-10-CM | POA: Diagnosis present

## 2022-02-25 DIAGNOSIS — G4733 Obstructive sleep apnea (adult) (pediatric): Secondary | ICD-10-CM | POA: Diagnosis present

## 2022-02-25 DIAGNOSIS — J189 Pneumonia, unspecified organism: Secondary | ICD-10-CM | POA: Diagnosis present

## 2022-02-25 DIAGNOSIS — Z20822 Contact with and (suspected) exposure to covid-19: Secondary | ICD-10-CM | POA: Diagnosis present

## 2022-02-25 DIAGNOSIS — Z955 Presence of coronary angioplasty implant and graft: Secondary | ICD-10-CM | POA: Diagnosis not present

## 2022-02-25 DIAGNOSIS — R778 Other specified abnormalities of plasma proteins: Secondary | ICD-10-CM | POA: Diagnosis not present

## 2022-02-25 DIAGNOSIS — K219 Gastro-esophageal reflux disease without esophagitis: Secondary | ICD-10-CM | POA: Diagnosis present

## 2022-02-25 DIAGNOSIS — N179 Acute kidney failure, unspecified: Secondary | ICD-10-CM | POA: Diagnosis present

## 2022-02-25 DIAGNOSIS — Z7982 Long term (current) use of aspirin: Secondary | ICD-10-CM | POA: Diagnosis not present

## 2022-02-25 DIAGNOSIS — E876 Hypokalemia: Secondary | ICD-10-CM

## 2022-02-25 DIAGNOSIS — I2510 Atherosclerotic heart disease of native coronary artery without angina pectoris: Secondary | ICD-10-CM | POA: Diagnosis present

## 2022-02-25 DIAGNOSIS — K760 Fatty (change of) liver, not elsewhere classified: Secondary | ICD-10-CM | POA: Diagnosis present

## 2022-02-25 DIAGNOSIS — I248 Other forms of acute ischemic heart disease: Secondary | ICD-10-CM | POA: Diagnosis present

## 2022-02-25 DIAGNOSIS — Z79899 Other long term (current) drug therapy: Secondary | ICD-10-CM | POA: Diagnosis not present

## 2022-02-25 DIAGNOSIS — R6520 Severe sepsis without septic shock: Secondary | ICD-10-CM | POA: Diagnosis present

## 2022-02-25 DIAGNOSIS — Z9861 Coronary angioplasty status: Secondary | ICD-10-CM | POA: Diagnosis not present

## 2022-02-25 LAB — URINALYSIS, COMPLETE (UACMP) WITH MICROSCOPIC
Bilirubin Urine: NEGATIVE
Glucose, UA: NEGATIVE mg/dL
Ketones, ur: NEGATIVE mg/dL
Leukocytes,Ua: NEGATIVE
Nitrite: NEGATIVE
Protein, ur: 100 mg/dL — AB
Specific Gravity, Urine: 1.025 (ref 1.005–1.030)
pH: 5.5 (ref 5.0–8.0)

## 2022-02-25 LAB — COMPREHENSIVE METABOLIC PANEL
ALT: 94 U/L — ABNORMAL HIGH (ref 0–44)
AST: 165 U/L — ABNORMAL HIGH (ref 15–41)
Albumin: 2.8 g/dL — ABNORMAL LOW (ref 3.5–5.0)
Alkaline Phosphatase: 86 U/L (ref 38–126)
Anion gap: 9 (ref 5–15)
BUN: 40 mg/dL — ABNORMAL HIGH (ref 8–23)
CO2: 21 mmol/L — ABNORMAL LOW (ref 22–32)
Calcium: 8 mg/dL — ABNORMAL LOW (ref 8.9–10.3)
Chloride: 102 mmol/L (ref 98–111)
Creatinine, Ser: 1.66 mg/dL — ABNORMAL HIGH (ref 0.61–1.24)
GFR, Estimated: 45 mL/min — ABNORMAL LOW (ref >60)
Glucose, Bld: 108 mg/dL — ABNORMAL HIGH (ref 70–99)
Potassium: 3.4 mmol/L — ABNORMAL LOW (ref 3.5–5.1)
Sodium: 132 mmol/L — ABNORMAL LOW (ref 135–145)
Total Bilirubin: 1.3 mg/dL — ABNORMAL HIGH (ref 0.3–1.2)
Total Protein: 6.1 g/dL — ABNORMAL LOW (ref 6.5–8.1)

## 2022-02-25 LAB — RESPIRATORY PANEL BY PCR

## 2022-02-25 LAB — CBC
HCT: 41.9 % (ref 39.0–52.0)
Hemoglobin: 14.2 g/dL (ref 13.0–17.0)
MCH: 30.3 pg (ref 26.0–34.0)
MCHC: 33.9 g/dL (ref 30.0–36.0)
MCV: 89.3 fL (ref 80.0–100.0)
Platelets: 153 10*3/uL (ref 150–400)
RBC: 4.69 MIL/uL (ref 4.22–5.81)
RDW: 14.6 % (ref 11.5–15.5)
WBC: 12.7 10*3/uL — ABNORMAL HIGH (ref 4.0–10.5)
nRBC: 0 % (ref 0.0–0.2)

## 2022-02-25 LAB — C DIFFICILE QUICK SCREEN W PCR REFLEX
C Diff antigen: NEGATIVE
C Diff interpretation: NOT DETECTED
C Diff toxin: NEGATIVE

## 2022-02-25 LAB — MRSA NEXT GEN BY PCR, NASAL: MRSA by PCR Next Gen: NOT DETECTED

## 2022-02-25 LAB — STREP PNEUMONIAE URINARY ANTIGEN: Strep Pneumo Urinary Antigen: NEGATIVE

## 2022-02-25 LAB — HIV ANTIBODY (ROUTINE TESTING W REFLEX): HIV Screen 4th Generation wRfx: NONREACTIVE

## 2022-02-25 MED ORDER — ACETAMINOPHEN 325 MG PO TABS
650.0000 mg | ORAL_TABLET | Freq: Once | ORAL | Status: AC
  Administered 2022-02-25: 650 mg via ORAL
  Filled 2022-02-25: qty 2

## 2022-02-25 MED ORDER — POTASSIUM CHLORIDE 10 MEQ/100ML IV SOLN
INTRAVENOUS | Status: AC
  Filled 2022-02-25: qty 100

## 2022-02-25 MED ORDER — BOOST / RESOURCE BREEZE PO LIQD CUSTOM
1.0000 | Freq: Three times a day (TID) | ORAL | Status: DC
  Administered 2022-02-25 – 2022-02-28 (×9): 1 via ORAL

## 2022-02-25 MED ORDER — ORAL CARE MOUTH RINSE
15.0000 mL | Freq: Two times a day (BID) | OROMUCOSAL | Status: DC
  Administered 2022-02-25 – 2022-02-28 (×5): 15 mL via OROMUCOSAL

## 2022-02-25 NOTE — Plan of Care (Signed)
  Problem: Respiratory: Goal: Ability to maintain a clear airway will improve Outcome: Progressing   Problem: Education: Goal: Knowledge of General Education information will improve Description: Including pain rating scale, medication(s)/side effects and non-pharmacologic comfort measures Outcome: Progressing   Problem: Coping: Goal: Level of anxiety will decrease Outcome: Progressing   Problem: Elimination: Goal: Will not experience complications related to urinary retention Outcome: Progressing   Problem: Pain Managment: Goal: General experience of comfort will improve Outcome: Progressing   Problem: Safety: Goal: Ability to remain free from injury will improve Outcome: Progressing   Problem: Skin Integrity: Goal: Risk for impaired skin integrity will decrease Outcome: Progressing

## 2022-02-25 NOTE — Evaluation (Signed)
Physical Therapy Evaluation Patient Details Name: Stephen Nash MRN: [id number] DOB: May 21, 1956 Today's Date: 02/25/2022  History of Present Illness  66 year old male with a history of cholecystectomy, coronary artery disease, admitted to the hospital with sepsis secondary to community-acquired pneumonia and  acute kidney injury.  Clinical Impression  Pt admitted with above diagnosis.  Pt motivated to mobilize however feeling extremely weak, dyspneic with light activity. VSS. Experiencing bowel urgency. Pt returned to EOB with wife present, wanting to sit up but declined recliner.  Anticipate pt will progress steadily in acute setting. Recommend HHPT f/u.  Pt currently with functional limitations due to the deficits listed below (see PT Problem List). Pt will benefit from skilled PT to increase their independence and safety with mobility to allow discharge to the venue listed below.          Recommendations for follow up therapy are one component of a multi-disciplinary discharge planning process, led by the attending physician.  Recommendations may be updated based on patient status, additional functional criteria and insurance authorization.  Follow Up Recommendations Home health PT    Assistance Recommended at Discharge Intermittent Supervision/Assistance  Patient can return home with the following  Assistance with cooking/housework;Assist for transportation;Help with stairs or ramp for entrance    Equipment Recommendations Other (comment) (TBD)  Recommendations for Other Services       Functional Status Assessment Patient has had a recent decline in their functional status and demonstrates the ability to make significant improvements in function in a reasonable and predictable amount of time.     Precautions / Restrictions Precautions Precautions: Fall Restrictions Weight Bearing Restrictions: No      Mobility  Bed Mobility Overal bed mobility: Needs Assistance Bed Mobility:  Supine to Sit, Sit to Supine     Supine to sit: Min assist Sit to supine: Min assist   General bed mobility comments: assist to elevate trunk;    Transfers Overall transfer level: Needs assistance Equipment used: Rolling walker (2 wheels) Transfers: Sit to/from Stand, Bed to chair/wheelchair/BSC Sit to Stand: Min assist   Step pivot transfers: Min assist       General transfer comment: assist to rise and steady, prefers to pull on RW standing from bed; adheres to cues to push from [facility]    Ambulation/Gait               General Gait Details: deferred d/t fatigue  Stairs            Wheelchair Mobility    Modified Rankin (Stroke Patients Only)       Balance Overall balance assessment: Needs assistance Sitting-balance support: No upper extremity supported, Feet supported Sitting balance-Leahy Scale: Fair     Standing balance support: Reliant on assistive device for balance Standing balance-Leahy Scale: Poor                               Pertinent Vitals/Pain Pain Assessment Pain Assessment: No/denies pain    Home Living Family/patient expects to be discharged to:: Private residence Living Arrangements: Spouse/significant other Available Help at Discharge: Family Type of Home: House Home Access: Stairs to enter   Technical brewer of Steps: 2   Home Layout: One level Home Equipment: Conservation officer, nature (2 wheels);BSC/3in1;Transport chair;Wheelchair - manual Additional Comments: pt is a Administrator; drives lumbar to [place]    Prior Function Prior Level of Function : Independent/Modified Independent;Driving;Working/employed  Hand Dominance        Extremity/Trunk Assessment   Upper Extremity Assessment Upper Extremity Assessment: Defer to OT evaluation    Lower Extremity Assessment Lower Extremity Assessment: Generalized weakness       Communication   Communication: No difficulties  Cognition  Arousal/Alertness: Awake/alert Behavior During Therapy: WFL for tasks assessed/performed Overall Cognitive Status: Within Functional Limits for tasks assessed                                          General Comments      Exercises     Assessment/Plan    PT Assessment Patient needs continued PT services  PT Problem List Decreased strength;Decreased activity tolerance;Decreased balance;Decreased mobility       PT Treatment Interventions DME instruction;Therapeutic exercise;Gait training;Functional mobility training;Therapeutic activities;Patient/family education    PT Goals (Current goals can be found in the Care Plan section)  Acute Rehab PT Goals 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 PT Goal Formulation: With patient Time For Goal Achievement: 03/11/22 Potential to Achieve Goals: Good    Frequency Min 3X/week     Co-evaluation               AM-PAC PT "6 Clicks" Mobility  Outcome Measure Help needed turning from your back to your side while in a flat bed without using bedrails?: A Little Help needed moving from lying on your back to sitting on the side of a flat bed without using bedrails?: A Little Help needed moving to and from a bed to a chair (including a wheelchair)?: A Little Help needed standing up from a chair using your arms (e.g., wheelchair or bedside chair)?: A Little Help needed to walk in hospital room?: A Lot Help needed climbing 3-5 steps with a railing? : Total 6 Click Score: 15    End of Session   Activity Tolerance: Patient limited by fatigue Patient left: Other (comment);with call bell/phone within reach;with bed alarm set (EOB) Nurse Communication: Mobility status PT Visit Diagnosis: Muscle weakness (generalized) (M62.81);Unsteadiness on feet (R26.81)    Time: 7121-9758 PT Time Calculation (min) (ACUTE ONLY): 26 min   Charges:   PT Evaluation $PT Eval Low Complexity: 1 Low PT Treatments $Therapeutic Activity: 8-22 mins         Baxter Flattery, PT  Acute Rehab Dept ([place]) [phone] Pager [phone]  02/25/2022   [facility] 02/25/2022, 4:59 PM

## 2022-02-25 NOTE — Progress Notes (Signed)
PROGRESS NOTE    Stephen Nash  LEX:[id number] DOB: [birth year] DOA: 02/24/2022 PCP: Madison Hickman, FNP    Brief Narrative:  66 year old male with a history of cholecystectomy, coronary artery disease, admitted to the hospital with sepsis secondary to community-acquired pneumonia.  Also have acute kidney injury.  Started on IV fluids and IV antibiotics.   Assessment & Plan:   Principal Problem:   CAP (community acquired pneumonia) Active Problems:   Sepsis ([place])   AKI (acute kidney injury) ([place])   Transaminitis   Flank pain   Elevated troponin   CAD S/P percutaneous coronary angioplasty   OSA (obstructive sleep apnea)   Hypokalemia   Sepsis -Noted to have leukocytosis, fever, tachycardia on admission -Source is pneumonia -Organ dysfunction includes AKI -Lactic acid elevated at 2.2 on admission, improved to 1.5 with IV fluids -Started on broad-spectrum antibiotics -Follow-up blood cultures  Community-acquired pneumonia -Started on ceftriaxone and azithromycin -COVID-negative -Respiratory viral panel negative -Continue supportive measures  Acute kidney injury -Holding ARB and diuretics -Continue IV fluids -Reports having difficulty passing urine, will check bladder scan  Transaminitis -Transaminases noted to be normal range 07/2021 -Currently, AST 165, ALT 94 -Possibly secondary to sepsis -Status post cholecystectomy -We will check right upper quadrant ultrasound for any CBD dilatation -Hold statin  Elevated troponin -No complaints of chest pain -Suspect is related to demand ischemia -Trend has been flat  OSA -Continue CPAP nightly  Hypokalemia -Replace  DVT prophylaxis: enoxaparin (LOVENOX) injection 40 mg Start: 02/24/22 2200  Code Status: Full code Family Communication: updated wife over the phone 5/30 Disposition Plan: Status is: Observation The patient will require care spanning > 2 midnights and should be moved to inpatient because: Continued  management of pneumonia and AKI with IV fluids and IV antibiotics.     Consultants:    Procedures:    Antimicrobials:  Ceftriaxone 5/29 > Azithromycin 5/29 >   Subjective: Patient says he still feels very weak.  Still has shortness of breath.  Does not feel well.  Objective: Vitals:   02/25/22 0331 02/25/22 0652 02/25/22 0653 02/25/22 1207  BP:   (!) 111/59 118/65  Pulse:   64 60  Resp:   18 20  Temp: (!) 100.7 F (38.2 C)  97.8 F (36.6 C) 97.9 F (36.6 C)  TempSrc:   Oral Oral  SpO2:  97% 95% 94%  Weight:      Height:        Intake/Output Summary (Last 24 hours) at 02/25/2022 1248 Last data filed at 02/25/2022 0600 Gross per 24 hour  Intake 1917.86 ml  Output 300 ml  Net 1617.86 ml   Filed Weights   02/24/22 1805 02/24/22 2135  Weight: 104.3 kg 99.5 kg    Examination:  General exam: Appears calm and comfortable  Respiratory system: Clear to auscultation. Respiratory effort normal. Cardiovascular system: S1 & S2 heard, RRR. No JVD, murmurs, rubs, gallops or clicks. No pedal edema. Gastrointestinal system: Abdomen is nondistended, soft and nontender. No organomegaly or masses felt. Normal bowel sounds heard. Central nervous system: Alert and oriented. No focal neurological deficits. Extremities: Symmetric 5 x 5 power. Skin: No rashes, lesions or ulcers Psychiatry: Judgement and insight appear normal. Mood & affect appropriate.     Data Reviewed: I have personally reviewed following labs and imaging studies  CBC: Recent Labs  Lab 02/21/22 1624 02/24/22 1848 02/25/22 0400  WBC 11.0* 13.6* 12.7*  NEUTROABS 8.8* 11.5*  --   HGB 15.7 17.3* 14.2  HCT 45.3  48.3 41.9  MCV 89.9 87.3 89.3  PLT 172 181 923   Basic Metabolic Panel: Recent Labs  Lab 02/21/22 1624 02/24/22 1848 02/25/22 0400  NA 138 131* 132*  K 3.9 3.1* 3.4*  CL 103 95* 102  CO2 26 23 21*  GLUCOSE 138* 133* 108*  BUN 22 40* 40*  CREATININE 1.31* 2.09* 1.66*  CALCIUM 9.0 8.8*  8.0*   GFR: Estimated Creatinine Clearance: 47.7 mL/min (A) (by C-G formula based on SCr of 1.66 mg/dL (H)). Liver Function Tests: Recent Labs  Lab 02/24/22 1848 02/25/22 0400  AST 163* 165*  ALT 92* 94*  ALKPHOS 105 86  BILITOT 1.6* 1.3*  PROT 8.4* 6.1*  ALBUMIN 3.7 2.8*   Recent Labs  Lab 02/24/22 1848  LIPASE 102*   No results for input(s): AMMONIA in the last 168 hours. Coagulation Profile: Recent Labs  Lab 02/24/22 1848  INR 1.1   Cardiac Enzymes: No results for input(s): CKTOTAL, CKMB, CKMBINDEX, TROPONINI in the last 168 hours. BNP (last 3 results) No results for input(s): PROBNP in the last 8760 hours. HbA1C: No results for input(s): HGBA1C in the last 72 hours. CBG: Recent Labs  Lab 02/24/22 1935  GLUCAP 126*   Lipid Profile: No results for input(s): CHOL, HDL, LDLCALC, TRIG, CHOLHDL, LDLDIRECT in the last 72 hours. Thyroid Function Tests: No results for input(s): TSH, T4TOTAL, FREET4, T3FREE, THYROIDAB in the last 72 hours. Anemia Panel: No results for input(s): VITAMINB12, FOLATE, FERRITIN, TIBC, IRON, RETICCTPCT in the last 72 hours. Sepsis Labs: Recent Labs  Lab 02/24/22 1848 02/24/22 2303  LATICACIDVEN 2.2* 1.5    Recent Results (from the past 240 hour(s))  Resp Panel by RT-PCR (Flu A&B, Covid) Anterior Nasal Swab     Status: None   Collection Time: 02/21/22  6:44 PM   Specimen: Anterior Nasal Swab  Result Value Ref Range Status   SARS Coronavirus 2 by RT PCR NEGATIVE NEGATIVE Final    Comment: (NOTE) SARS-CoV-2 target nucleic acids are NOT DETECTED.  The SARS-CoV-2 RNA is generally detectable in upper respiratory specimens during the acute phase of infection. The lowest concentration of SARS-CoV-2 viral copies this assay can detect is 138 copies/mL. A negative result does not preclude SARS-Cov-2 infection and should not be used as the sole basis for treatment or other patient management decisions. A negative result may occur with   improper specimen collection/handling, submission of specimen other than nasopharyngeal swab, presence of viral mutation(s) within the areas targeted by this assay, and inadequate number of viral copies(<138 copies/mL). A negative result must be combined with clinical observations, patient history, and epidemiological information. The expected result is Negative.  Fact Sheet for Patients:  EntrepreneurPulse.com.au  Fact Sheet for Healthcare Providers:  IncredibleEmployment.be  This test is no t yet approved or cleared by the [place] FDA and  has been authorized for detection and/or diagnosis of SARS-CoV-2 by FDA under an Emergency Use Authorization (EUA). This EUA will remain  in effect (meaning this test can be used) for the duration of the COVID-19 declaration under Section 564(b)(1) of the Act, 21 U.S.C.section 360bbb-3(b)(1), unless the authorization is terminated  or revoked sooner.       Influenza A by PCR NEGATIVE NEGATIVE Final   Influenza B by PCR NEGATIVE NEGATIVE Final    Comment: (NOTE) The Xpert Xpress SARS-CoV-2/FLU/RSV plus assay is intended as an aid in the diagnosis of influenza from Nasopharyngeal swab specimens and should not be used as a sole basis for treatment.  Nasal washings and aspirates are unacceptable for Xpert Xpress SARS-CoV-2/FLU/RSV testing.  Fact Sheet for Patients: EntrepreneurPulse.com.au  Fact Sheet for Healthcare Providers: IncredibleEmployment.be  This test is not yet approved or cleared by the [place] FDA and has been authorized for detection and/or diagnosis of SARS-CoV-2 by FDA under an Emergency Use Authorization (EUA). This EUA will remain in effect (meaning this test can be used) for the duration of the COVID-19 declaration under Section 564(b)(1) of the Act, 21 U.S.C. section 360bbb-3(b)(1), unless the authorization is terminated  or revoked.  Performed at [facility], [place] [address] [place], [place] [address]   Culture, blood (routine x 2)     Status: None (Preliminary result)   Collection Time: 02/24/22  6:25 PM   Specimen: BLOOD  Result Value Ref Range Status   Specimen Description   Final    BLOOD BLOOD RIGHT HAND Performed at [facility] [address] [place], [place] [address]    Special Requests   Final    BOTTLES DRAWN AEROBIC AND ANAEROBIC Blood Culture results may not be optimal due to an inadequate volume of blood received in culture bottles Performed at [facility] [address], [place], [place] [address]    Culture   Final    NO GROWTH < 12 HOURS Performed at [facility] [address] [place], [place] [address]    Report Status PENDING  Incomplete  Resp Panel by RT-PCR (Flu A&B, Covid) Anterior Nasal Swab     Status: None   Collection Time: 02/24/22  6:48 PM   Specimen: Anterior Nasal Swab  Result Value Ref Range Status   SARS Coronavirus 2 by RT PCR NEGATIVE NEGATIVE Final    Comment: (NOTE) SARS-CoV-2 target nucleic acids are NOT DETECTED.  The SARS-CoV-2 RNA is generally detectable in upper respiratory specimens during the acute phase of infection. The lowest concentration of SARS-CoV-2 viral copies this assay can detect is 138 copies/mL. A negative result does not preclude SARS-Cov-2 infection and should not be used as the sole basis for treatment or other patient management decisions. A negative result may occur with  improper specimen collection/handling, submission of specimen other than nasopharyngeal swab, presence of viral mutation(s) within the areas targeted by this assay, and inadequate number of viral copies(<138 copies/mL). A negative result must be combined with clinical observations, patient history, and epidemiological information. The expected result is Negative.  Fact Sheet for Patients:   EntrepreneurPulse.com.au  Fact Sheet for Healthcare Providers:  IncredibleEmployment.be  This test is no t yet approved or cleared by the [place] FDA and  has been authorized for detection and/or diagnosis of SARS-CoV-2 by FDA under an Emergency Use Authorization (EUA). This EUA will remain  in effect (meaning this test can be used) for the duration of the COVID-19 declaration under Section 564(b)(1) of the Act, 21 U.S.C.section 360bbb-3(b)(1), unless the authorization is terminated  or revoked sooner.       Influenza A by PCR NEGATIVE NEGATIVE Final   Influenza B by PCR NEGATIVE NEGATIVE Final    Comment: (NOTE) The Xpert Xpress SARS-CoV-2/FLU/RSV plus assay is intended as an aid in the diagnosis of influenza from Nasopharyngeal swab specimens and should not be used as a sole basis for treatment. Nasal washings and aspirates are unacceptable for Xpert Xpress SARS-CoV-2/FLU/RSV testing.  Fact Sheet for Patients: EntrepreneurPulse.com.au  Fact Sheet for Healthcare Providers: IncredibleEmployment.be  This test is not yet approved or cleared by the  [place] Architectural technologist and has been authorized for detection and/or diagnosis of SARS-CoV-2 by FDA under an Print production planner (EUA). This EUA will remain in effect (meaning this test can be used) for the duration of the COVID-19 declaration under Section 564(b)(1) of the Act, 21 U.S.C. section 360bbb-3(b)(1), unless the authorization is terminated or revoked.  Performed at [facility], [place] [address], [place], [place] [address]   Culture, blood (routine x 2)     Status: None (Preliminary result)   Collection Time: 02/24/22  6:48 PM   Specimen: BLOOD  Result Value Ref Range Status   Specimen Description   Final    BLOOD LEFT ANTECUBITAL Performed at [facility] [address], [place], [place] [address]     Special Requests   Final    BOTTLES DRAWN AEROBIC AND ANAEROBIC Blood Culture adequate volume Performed at [facility] [address], [place], [place] [address]    Culture   Final    NO GROWTH < 12 HOURS Performed at [facility] [address], [place], [place] [address]    Report Status PENDING  Incomplete  Respiratory (~20 pathogens) panel by PCR     Status: None   Collection Time: 02/24/22 11:06 PM   Specimen: Nasopharyngeal Swab; Respiratory  Result Value Ref Range Status   Adenovirus NOT DETECTED NOT DETECTED Final   Coronavirus 229E NOT DETECTED NOT DETECTED Final    Comment: (NOTE) The Coronavirus on the Respiratory Panel, DOES NOT test for the novel  Coronavirus (2019 nCoV)    Coronavirus HKU1 NOT DETECTED NOT DETECTED Final   Coronavirus NL63 NOT DETECTED NOT DETECTED Final   Coronavirus OC43 NOT DETECTED NOT DETECTED Final   Metapneumovirus NOT DETECTED NOT DETECTED Final   Rhinovirus / Enterovirus NOT DETECTED NOT DETECTED Final   Influenza A NOT DETECTED NOT DETECTED Final   Influenza B NOT DETECTED NOT DETECTED Final   Parainfluenza Virus 1 NOT DETECTED NOT DETECTED Final   Parainfluenza Virus 2 NOT DETECTED NOT DETECTED Final   Parainfluenza Virus 3 NOT DETECTED NOT DETECTED Final   Parainfluenza Virus 4 NOT DETECTED NOT DETECTED Final   Respiratory Syncytial Virus NOT DETECTED NOT DETECTED Final   Bordetella pertussis NOT DETECTED NOT DETECTED Final   Bordetella Parapertussis NOT DETECTED NOT DETECTED Final   Chlamydophila pneumoniae NOT DETECTED NOT DETECTED Final   Mycoplasma pneumoniae NOT DETECTED NOT DETECTED Final    Comment: Performed at [facility], [place]. [address] [place], [place] [address]  MRSA Next Gen by PCR, Nasal     Status: None   Collection Time: 02/24/22 11:07 PM   Specimen: Nasal Mucosa; Nasal Swab  Result Value Ref Range Status   MRSA by PCR Next Gen NOT DETECTED NOT DETECTED Final    Comment: (NOTE) The  GeneXpert MRSA Assay (FDA approved for NASAL specimens only), is one component of a comprehensive MRSA colonization surveillance program. It is not intended to diagnose MRSA infection nor to guide or monitor treatment for MRSA infections. Test performance is not FDA approved in patients less than 23 years old. Performed at [facility], [place] [address] [place],  [address]          Radiology Studies: [facility] Chest Port 1 View  Result Date: 02/24/2022 CLINICAL DATA:  Shortness of breath weakness and fever. Productive cough with white sputum. EXAM: PORTABLE CHEST 1 VIEW COMPARISON:  AP chest 08/23/2021 FINDINGS: Cardiac silhouette and mediastinal  contours within normal limits with mild calcification within aortic arch. New heterogeneous airspace opacification throughout the superolateral left lung. The right lung remains clear. No pleural effusion or pneumothorax. Mild-to-moderate multilevel degenerative disc changes of the thoracic spine. Mild dextrocurvature of the midthoracic spine. IMPRESSION: New heterogeneous airspace opacification throughout the superolateral left lung suspicious for pneumonia. Recommend follow-up radiographs 6 weeks after treatment to ensure resolution. Electronically Signed   By: Yvonne Kendall M.D.   On: 02/24/2022 18:40        Scheduled Meds:  allopurinol  300 mg Oral Daily   aspirin EC  81 mg Oral Daily   atorvastatin  80 mg Oral QPM   enoxaparin (LOVENOX) injection  40 mg Subcutaneous Q24H   feeding supplement  1 Container Oral TID BM   mouth rinse  15 mL Mouth Rinse BID   pantoprazole  40 mg Oral BID   Continuous Infusions:  azithromycin     cefTRIAXone (ROCEPHIN)  IV     lactated ringers 125 mL/hr at 02/25/22 0509     LOS: 0 days    Time spent: 39mns    JKathie Dike MD [facility]   If 7PM-7AM, please contact night-coverage [email or web address]  02/25/2022, 12:48 PM

## 2022-02-25 NOTE — TOC Initial Note (Signed)
Transition of Care [facility]) - Initial/Assessment Note    Patient Details  Name: Stephen Nash MRN: [id number] Date of Birth: 06-23-1956  Transition of Care [facility]) CM/SW Contact:    Leeroy Cha, RN Phone Number: 02/25/2022, 8:34 AM  Clinical Narrative:                  Transition of Care [facility]) Screening Note   Patient Details  Name: Stephen Nash Date of Birth: 06-09-1956   Transition of Care [facility]) CM/SW Contact:    Leeroy Cha, RN Phone Number: 02/25/2022, 8:34 AM    Transition of Care Department [facility]) has reviewed patient and no TOC needs have been identified at this time. We will continue to monitor patient advancement through interdisciplinary progression rounds. If new patient transition needs arise, please place a TOC consult.    Expected Discharge Plan: Home/Self Care Barriers to Discharge: No Barriers Identified   Patient Goals and CMS Choice Patient states their goals for this hospitalization and ongoing recovery are:: to go home CMS Medicare.gov Compare Post Acute Care list provided to:: Patient Choice offered to / list presented to : Patient  Expected Discharge Plan and Services Expected Discharge Plan: Home/Self Care   Discharge Planning Services: CM Consult   Living arrangements for the past 2 months: Single Family Home                                      Prior Living Arrangements/Services Living arrangements for the past 2 months: Single Family Home Lives with:: Spouse Patient language and need for interpreter reviewed:: Yes Do you feel safe going back to the place where you live?: Yes            Criminal Activity/Legal Involvement Pertinent to Current Situation/Hospitalization: No - Comment as needed  Activities of Daily Living Home Assistive Devices/Equipment: Eyeglasses ADL Screening (condition at time of admission) Patient's cognitive ability adequate to safely complete daily activities?: Yes Is the patient deaf or have  difficulty hearing?: No Does the patient have difficulty seeing, even when wearing glasses/contacts?: Yes Does the patient have difficulty concentrating, remembering, or making decisions?: Yes (possibly secondary to patient's willness) Patient able to express need for assistance with ADLs?: Yes Does the patient have difficulty dressing or bathing?: Yes (probably secondary to illness) Independently performs ADLs?: No (possibly secondary to patient's current illness) Communication: Independent Dressing (OT): Needs assistance Is this a change from baseline?: Change from baseline, expected to last <3days Grooming: Independent Feeding: Independent Bathing: Needs assistance Is this a change from baseline?: Change from baseline, expected to last <3 days Toileting: Needs assistance Is this a change from baseline?: Change from baseline, expected to last <3 days In/Out Bed: Needs assistance Is this a change from baseline?: Change from baseline, expected to last <3 days Walks in Home: Needs assistance Is this a change from baseline?: Change from baseline, expected to last <3 days Does the patient have difficulty walking or climbing stairs?: Yes Weakness of Legs: Both Weakness of Arms/Hands: Both  Permission Sought/Granted                  Emotional Assessment Appearance:: Appears stated age Attitude/Demeanor/Rapport: Engaged Affect (typically observed): Calm Orientation: : Oriented to Self, Oriented to Place, Oriented to  Time, Oriented to Situation Alcohol / Substance Use: Not Applicable Psych Involvement: No (comment)  Admission diagnosis:  CAP (community acquired pneumonia) [J18.9] Patient Active Problem  List   Diagnosis Date Noted   AKI (acute kidney injury) ([place]) 02/24/2022   CAP (community acquired pneumonia) 02/24/2022   Transaminitis 02/24/2022   Flank pain 02/24/2022   Sepsis ([place]) 02/24/2022   Elevated troponin 02/24/2022   CAD S/P percutaneous coronary angioplasty  02/24/2022   Esophageal dysphagia    Gastroesophageal reflux disease    PCP:  Madison Hickman, FNP Pharmacy:   [facility], [place] - [address] [place] [address] [place] [address] Phone: [phone] Fax: [phone]     Social Determinants of Health (SDOH) Interventions    Readmission Risk Interventions     View : No data to display.

## 2022-02-25 NOTE — Progress Notes (Signed)
Patient had a temperature of 100.7 after non-pharmalogical measures. Notified the on call provider. On call provider gave new order.

## 2022-02-26 DIAGNOSIS — R778 Other specified abnormalities of plasma proteins: Secondary | ICD-10-CM | POA: Diagnosis not present

## 2022-02-26 DIAGNOSIS — N179 Acute kidney failure, unspecified: Secondary | ICD-10-CM | POA: Diagnosis not present

## 2022-02-26 DIAGNOSIS — J189 Pneumonia, unspecified organism: Secondary | ICD-10-CM | POA: Diagnosis not present

## 2022-02-26 LAB — LEGIONELLA PNEUMOPHILA SEROGP 1 UR AG: L. pneumophila Serogp 1 Ur Ag: NEGATIVE

## 2022-02-26 LAB — CBC
HCT: 39.5 % (ref 39.0–52.0)
Hemoglobin: 13.8 g/dL (ref 13.0–17.0)
MCH: 31 pg (ref 26.0–34.0)
MCHC: 34.9 g/dL (ref 30.0–36.0)
MCV: 88.8 fL (ref 80.0–100.0)
Platelets: 148 10*3/uL — ABNORMAL LOW (ref 150–400)
RBC: 4.45 MIL/uL (ref 4.22–5.81)
RDW: 14.8 % (ref 11.5–15.5)
WBC: 8.6 10*3/uL (ref 4.0–10.5)
nRBC: 0 % (ref 0.0–0.2)

## 2022-02-26 LAB — COMPREHENSIVE METABOLIC PANEL
ALT: 164 U/L — ABNORMAL HIGH (ref 0–44)
AST: 279 U/L — ABNORMAL HIGH (ref 15–41)
Albumin: 2.7 g/dL — ABNORMAL LOW (ref 3.5–5.0)
Alkaline Phosphatase: 94 U/L (ref 38–126)
Anion gap: 12 (ref 5–15)
BUN: 40 mg/dL — ABNORMAL HIGH (ref 8–23)
CO2: 21 mmol/L — ABNORMAL LOW (ref 22–32)
Calcium: 8.2 mg/dL — ABNORMAL LOW (ref 8.9–10.3)
Chloride: 103 mmol/L (ref 98–111)
Creatinine, Ser: 1.56 mg/dL — ABNORMAL HIGH (ref 0.61–1.24)
GFR, Estimated: 48 mL/min — ABNORMAL LOW (ref >60)
Glucose, Bld: 109 mg/dL — ABNORMAL HIGH (ref 70–99)
Potassium: 3.2 mmol/L — ABNORMAL LOW (ref 3.5–5.1)
Sodium: 136 mmol/L (ref 135–145)
Total Bilirubin: 0.9 mg/dL (ref 0.3–1.2)
Total Protein: 6 g/dL — ABNORMAL LOW (ref 6.5–8.1)

## 2022-02-26 MED ORDER — SODIUM CHLORIDE 0.9 % IV SOLN: INTRAVENOUS | Status: AC

## 2022-02-26 MED ORDER — CHLORHEXIDINE GLUCONATE 0.12 % MT SOLN
15.0000 mL | Freq: Two times a day (BID) | OROMUCOSAL | Status: DC
  Administered 2022-02-26 – 2022-02-28 (×4): 15 mL via OROMUCOSAL
  Filled 2022-02-26 (×5): qty 15

## 2022-02-26 MED ORDER — POTASSIUM CHLORIDE CRYS ER 20 MEQ PO TBCR
40.0000 meq | EXTENDED_RELEASE_TABLET | Freq: Once | ORAL | Status: AC
  Administered 2022-02-26: 40 meq via ORAL
  Filled 2022-02-26: qty 2

## 2022-02-26 NOTE — Progress Notes (Signed)
PROGRESS NOTE    Stephen Nash  ZOX:[id number] DOB: 1956/07/18 DOA: 02/24/2022 PCP: Madison Hickman, FNP    Brief Narrative:  66 year old male with a history of cholecystectomy, coronary artery disease, admitted to the hospital with sepsis secondary to community-acquired pneumonia.  Also had acute kidney injury.  Started on IV fluids and IV antibiotics.  Very weak   Assessment & Plan:   Principal Problem:   CAP (community acquired pneumonia) Active Problems:   Sepsis ([place])   AKI (acute kidney injury) ([place])   Transaminitis   Flank pain   Elevated troponin   CAD S/P percutaneous coronary angioplasty   OSA (obstructive sleep apnea)   Hypokalemia   Sepsis -Noted to have leukocytosis, fever, tachycardia on admission -Source is pneumonia -Organ dysfunction includes AKI -Lactic acid elevated at 2.2 on admission, improved to 1.5 with IV fluids -Started on broad-spectrum antibiotics -blood cultures NGTD  Community-acquired pneumonia -Started on ceftriaxone and azithromycin -COVID-negative -Respiratory viral panel negative -Continue supportive measures  Acute kidney injury -Holding ARB and diuretics -Continue IV fluids -PVR  Transaminitis -Transaminases noted to be normal range 07/2021 -Possibly secondary to sepsis -Status post cholecystectomy -RUQ U/S negative for CDB dilatation but does show fatty liver  Elevated troponin -No complaints of chest pain -Suspect is related to demand ischemia -Trend has been flat  OSA -Continue CPAP nightly  Hypokalemia -Replace  DVT prophylaxis: enoxaparin (LOVENOX) injection 40 mg Start: 02/24/22 2200  Code Status: Full code Family Communication: updated wife over the phone 5/30 Disposition Plan: Status is: inpt      Subjective: Hungry and weak  Objective: Vitals:   02/25/22 1207 02/25/22 2008 02/25/22 2106 02/26/22 0525  BP: 118/65  123/76 (!) 142/62  Pulse: 60 67 93 86  Resp: '20 20 18 20  '$ Temp: 97.9 F (36.6 C)   98.4 F (36.9 C) 99.9 F (37.7 C)  TempSrc: Oral     SpO2: 94%  97% 92%  Weight:      Height:        Intake/Output Summary (Last 24 hours) at 02/26/2022 1021 Last data filed at 02/26/2022 0600 Gross per 24 hour  Intake 3364.79 ml  Output 2104 ml  Net 1260.79 ml   Filed Weights   02/24/22 1805 02/24/22 2135  Weight: 104.3 kg 99.5 kg    Examination:   General: Appearance:    Obese male in no acute distress     Lungs:      respirations unlabored  Heart:    Normal heart rate. Normal rhythm. No murmurs, rubs, or gallops.   MS:   All extremities are intact.   Neurologic:   Awake, alert      Data Reviewed: I have personally reviewed following labs and imaging studies  CBC: Recent Labs  Lab 02/21/22 1624 02/24/22 1848 02/25/22 0400 02/26/22 0452  WBC 11.0* 13.6* 12.7* 8.6  NEUTROABS 8.8* 11.5*  --   --   HGB 15.7 17.3* 14.2 13.8  HCT 45.3 48.3 41.9 39.5  MCV 89.9 87.3 89.3 88.8  PLT 172 181 153 811*   Basic Metabolic Panel: Recent Labs  Lab 02/21/22 1624 02/24/22 1848 02/25/22 0400 02/26/22 0452  NA 138 131* 132* 136  K 3.9 3.1* 3.4* 3.2*  CL 103 95* 102 103  CO2 26 23 21* 21*  GLUCOSE 138* 133* 108* 109*  BUN 22 40* 40* 40*  CREATININE 1.31* 2.09* 1.66* 1.56*  CALCIUM 9.0 8.8* 8.0* 8.2*   GFR: Estimated Creatinine Clearance: 50.8 mL/min (A) (by C-G  formula based on SCr of 1.56 mg/dL (H)). Liver Function Tests: Recent Labs  Lab 02/24/22 1848 02/25/22 0400 02/26/22 0452  AST 163* 165* 279*  ALT 92* 94* 164*  ALKPHOS 105 86 94  BILITOT 1.6* 1.3* 0.9  PROT 8.4* 6.1* 6.0*  ALBUMIN 3.7 2.8* 2.7*   Recent Labs  Lab 02/24/22 1848  LIPASE 102*   No results for input(s): AMMONIA in the last 168 hours. Coagulation Profile: Recent Labs  Lab 02/24/22 1848  INR 1.1   Cardiac Enzymes: No results for input(s): CKTOTAL, CKMB, CKMBINDEX, TROPONINI in the last 168 hours. BNP (last 3 results) No results for input(s): PROBNP in the last 8760  hours. HbA1C: No results for input(s): HGBA1C in the last 72 hours. CBG: Recent Labs  Lab 02/24/22 1935  GLUCAP 126*   Lipid Profile: No results for input(s): CHOL, HDL, LDLCALC, TRIG, CHOLHDL, LDLDIRECT in the last 72 hours. Thyroid Function Tests: No results for input(s): TSH, T4TOTAL, FREET4, T3FREE, THYROIDAB in the last 72 hours. Anemia Panel: No results for input(s): VITAMINB12, FOLATE, FERRITIN, TIBC, IRON, RETICCTPCT in the last 72 hours. Sepsis Labs: Recent Labs  Lab 02/24/22 1848 02/24/22 2303  LATICACIDVEN 2.2* 1.5    Recent Results (from the past 240 hour(s))  Resp Panel by RT-PCR (Flu A&B, Covid) Anterior Nasal Swab     Status: None   Collection Time: 02/21/22  6:44 PM   Specimen: Anterior Nasal Swab  Result Value Ref Range Status   SARS Coronavirus 2 by RT PCR NEGATIVE NEGATIVE Final    Comment: (NOTE) SARS-CoV-2 target nucleic acids are NOT DETECTED.  The SARS-CoV-2 RNA is generally detectable in upper respiratory specimens during the acute phase of infection. The lowest concentration of SARS-CoV-2 viral copies this assay can detect is 138 copies/mL. A negative result does not preclude SARS-Cov-2 infection and should not be used as the sole basis for treatment or other patient management decisions. A negative result may occur with  improper specimen collection/handling, submission of specimen other than nasopharyngeal swab, presence of viral mutation(s) within the areas targeted by this assay, and inadequate number of viral copies(<138 copies/mL). A negative result must be combined with clinical observations, patient history, and epidemiological information. The expected result is Negative.  Fact Sheet for Patients:  EntrepreneurPulse.com.au  Fact Sheet for Healthcare Providers:  IncredibleEmployment.be  This test is no t yet approved or cleared by the [place] FDA and  has been authorized for detection and/or  diagnosis of SARS-CoV-2 by FDA under an Emergency Use Authorization (EUA). This EUA will remain  in effect (meaning this test can be used) for the duration of the COVID-19 declaration under Section 564(b)(1) of the Act, 21 U.S.C.section 360bbb-3(b)(1), unless the authorization is terminated  or revoked sooner.       Influenza A by PCR NEGATIVE NEGATIVE Final   Influenza B by PCR NEGATIVE NEGATIVE Final    Comment: (NOTE) The Xpert Xpress SARS-CoV-2/FLU/RSV plus assay is intended as an aid in the diagnosis of influenza from Nasopharyngeal swab specimens and should not be used as a sole basis for treatment. Nasal washings and aspirates are unacceptable for Xpert Xpress SARS-CoV-2/FLU/RSV testing.  Fact Sheet for Patients: EntrepreneurPulse.com.au  Fact Sheet for Healthcare Providers: IncredibleEmployment.be  This test is not yet approved or cleared by the [place] FDA and has been authorized for detection and/or diagnosis of SARS-CoV-2 by FDA under an Emergency Use Authorization (EUA). This EUA will remain in effect (meaning this test can be used) for the  duration of the COVID-19 declaration under Section 564(b)(1) of the Act, 21 U.S.C. section 360bbb-3(b)(1), unless the authorization is terminated or revoked.  Performed at [facility], [place] [address] [place], [place] [address]   Culture, blood (routine x 2)     Status: None (Preliminary result)   Collection Time: 02/24/22  6:25 PM   Specimen: BLOOD  Result Value Ref Range Status   Specimen Description   Final    BLOOD BLOOD RIGHT HAND Performed at [facility] [address] [place], [place] [address]    Special Requests   Final    BOTTLES DRAWN AEROBIC AND ANAEROBIC Blood Culture results may not be optimal due to an inadequate volume of blood received in culture bottles Performed at [facility] [address]  [place], [place] [address]    Culture   Final    NO GROWTH < 12 HOURS Performed at [facility] [address] [place], [place] [address]    Report Status PENDING  Incomplete  Resp Panel by RT-PCR (Flu A&B, Covid) Anterior Nasal Swab     Status: None   Collection Time: 02/24/22  6:48 PM   Specimen: Anterior Nasal Swab  Result Value Ref Range Status   SARS Coronavirus 2 by RT PCR NEGATIVE NEGATIVE Final    Comment: (NOTE) SARS-CoV-2 target nucleic acids are NOT DETECTED.  The SARS-CoV-2 RNA is generally detectable in upper respiratory specimens during the acute phase of infection. The lowest concentration of SARS-CoV-2 viral copies this assay can detect is 138 copies/mL. A negative result does not preclude SARS-Cov-2 infection and should not be used as the sole basis for treatment or other patient management decisions. A negative result may occur with  improper specimen collection/handling, submission of specimen other than nasopharyngeal swab, presence of viral mutation(s) within the areas targeted by this assay, and inadequate number of viral copies(<138 copies/mL). A negative result must be combined with clinical observations, patient history, and epidemiological information. The expected result is Negative.  Fact Sheet for Patients:  EntrepreneurPulse.com.au  Fact Sheet for Healthcare Providers:  IncredibleEmployment.be  This test is no t yet approved or cleared by the [place] FDA and  has been authorized for detection and/or diagnosis of SARS-CoV-2 by FDA under an Emergency Use Authorization (EUA). This EUA will remain  in effect (meaning this test can be used) for the duration of the COVID-19 declaration under Section 564(b)(1) of the Act, 21 U.S.C.section 360bbb-3(b)(1), unless the authorization is terminated  or revoked sooner.       Influenza A by PCR NEGATIVE NEGATIVE Final   Influenza B by PCR NEGATIVE NEGATIVE Final     Comment: (NOTE) The Xpert Xpress SARS-CoV-2/FLU/RSV plus assay is intended as an aid in the diagnosis of influenza from Nasopharyngeal swab specimens and should not be used as a sole basis for treatment. Nasal washings and aspirates are unacceptable for Xpert Xpress SARS-CoV-2/FLU/RSV testing.  Fact Sheet for Patients: EntrepreneurPulse.com.au  Fact Sheet for Healthcare Providers: IncredibleEmployment.be  This test is not yet approved or cleared by the [place] FDA and has been authorized for detection and/or diagnosis of SARS-CoV-2 by FDA under an Emergency Use Authorization (EUA). This EUA will remain in effect (meaning this test can be used) for the duration of the COVID-19 declaration under Section 564(b)(1) of the Act, 21 U.S.C. section 360bbb-3(b)(1), unless the authorization is terminated or revoked.  Performed at [facility], [place] [address] [place], [place]  [address]   Culture, blood (routine x 2)     Status: None (Preliminary result)   Collection Time: 02/24/22  6:48 PM   Specimen: BLOOD  Result Value Ref Range Status   Specimen Description   Final    BLOOD LEFT ANTECUBITAL Performed at [facility] [address], [place], [place] [address]    Special Requests   Final    BOTTLES DRAWN AEROBIC AND ANAEROBIC Blood Culture adequate volume Performed at [facility] [address], [place], [place] [address]    Culture   Final    NO GROWTH < 12 HOURS Performed at [facility] [address], [place], [place] [address]    Report Status PENDING  Incomplete  Respiratory (~20 pathogens) panel by PCR     Status: None   Collection Time: 02/24/22 11:06 PM   Specimen: Nasopharyngeal Swab; Respiratory  Result Value Ref Range Status   Adenovirus NOT DETECTED NOT DETECTED Final   Coronavirus 229E NOT DETECTED NOT DETECTED Final    Comment: (NOTE) The Coronavirus on  the Respiratory Panel, DOES NOT test for the novel  Coronavirus (2019 nCoV)    Coronavirus HKU1 NOT DETECTED NOT DETECTED Final   Coronavirus NL63 NOT DETECTED NOT DETECTED Final   Coronavirus OC43 NOT DETECTED NOT DETECTED Final   Metapneumovirus NOT DETECTED NOT DETECTED Final   Rhinovirus / Enterovirus NOT DETECTED NOT DETECTED Final   Influenza A NOT DETECTED NOT DETECTED Final   Influenza B NOT DETECTED NOT DETECTED Final   Parainfluenza Virus 1 NOT DETECTED NOT DETECTED Final   Parainfluenza Virus 2 NOT DETECTED NOT DETECTED Final   Parainfluenza Virus 3 NOT DETECTED NOT DETECTED Final   Parainfluenza Virus 4 NOT DETECTED NOT DETECTED Final   Respiratory Syncytial Virus NOT DETECTED NOT DETECTED Final   Bordetella pertussis NOT DETECTED NOT DETECTED Final   Bordetella Parapertussis NOT DETECTED NOT DETECTED Final   Chlamydophila pneumoniae NOT DETECTED NOT DETECTED Final   Mycoplasma pneumoniae NOT DETECTED NOT DETECTED Final    Comment: Performed at [facility], [place]. [address], [place], [place] [address]  MRSA Next Gen by PCR, Nasal     Status: None   Collection Time: 02/24/22 11:07 PM   Specimen: Nasal Mucosa; Nasal Swab  Result Value Ref Range Status   MRSA by PCR Next Gen NOT DETECTED NOT DETECTED Final    Comment: (NOTE) The GeneXpert MRSA Assay (FDA approved for NASAL specimens only), is one component of a comprehensive MRSA colonization surveillance program. It is not intended to diagnose MRSA infection nor to guide or monitor treatment for MRSA infections. Test performance is not FDA approved in patients less than 39 years old. Performed at [facility], [place] [address] [place], [place] [address]   C Difficile Quick Screen w PCR reflex     Status: None   Collection Time: 02/25/22  4:39 PM   Specimen: STOOL  Result Value Ref Range Status   C Diff antigen NEGATIVE NEGATIVE Final   C Diff toxin NEGATIVE NEGATIVE Final   C Diff  interpretation No C. difficile detected.  Final    Comment: Performed at [facility], [place] [address] [place], [place] [address]         Radiology Studies: [facility] Chest Port 1 View  Result Date: 02/24/2022 CLINICAL DATA:  Shortness of breath weakness and fever. Productive cough with white sputum. EXAM: PORTABLE CHEST 1 VIEW COMPARISON:  AP chest  08/23/2021 FINDINGS: Cardiac silhouette and mediastinal contours within normal limits with mild calcification within aortic arch. New heterogeneous airspace opacification throughout the superolateral left lung. The right lung remains clear. No pleural effusion or pneumothorax. Mild-to-moderate multilevel degenerative disc changes of the thoracic spine. Mild dextrocurvature of the midthoracic spine. IMPRESSION: New heterogeneous airspace opacification throughout the superolateral left lung suspicious for pneumonia. Recommend follow-up radiographs 6 weeks after treatment to ensure resolution. Electronically Signed   By: Yvonne Kendall M.D.   On: 02/24/2022 18:40   US Abdomen Limited RUQ (LIVER/GB)  Result Date: 02/25/2022 CLINICAL DATA:  Elevated liver function tests in a 66 year old male. EXAM: ULTRASOUND ABDOMEN LIMITED RIGHT UPPER QUADRANT COMPARISON:  CT renal stone protocol from Feb 21, 2022. FINDINGS: Gallbladder: Post cholecystectomy. Common bile duct: Diameter: 4.5 mm Liver: Liver with increased echogenicity and coarsened hepatic echotexture. No visible lesion on limited imaging due to patient body habitus and sonographic window. Portal vein is patent on color Doppler imaging with normal direction of blood flow towards the liver. Other: No perihepatic ascites. IMPRESSION: Limited exam due to patient body habitus with no biliary duct dilation following cholecystectomy. Hepatic steatosis and coarsened hepatic echotexture, correlate with any clinical or laboratory evidence of liver disease. Electronically Signed   By: Zetta Bills M.D.    On: 02/25/2022 16:24        Scheduled Meds:  allopurinol  300 mg Oral Daily   aspirin EC  81 mg Oral Daily   chlorhexidine  15 mL Mouth Rinse BID   enoxaparin (LOVENOX) injection  40 mg Subcutaneous Q24H   feeding supplement  1 Container Oral TID BM   mouth rinse  15 mL Mouth Rinse BID   pantoprazole  40 mg Oral BID   potassium chloride  40 mEq Oral Once   Continuous Infusions:  sodium chloride     azithromycin 500 mg (02/25/22 1734)   cefTRIAXone (ROCEPHIN)  IV 2 g (02/25/22 2049)     LOS: 1 day    Time spent: 45mns    Jermario Kalmar U Chiana Wamser, DO [facility]   If 7PM-7AM, please contact night-coverage [email or web address]  02/26/2022, 10:21 AM

## 2022-02-26 NOTE — Progress Notes (Signed)
Physical Therapy Treatment Patient Details Name: Stephen Nash MRN: [id number] DOB: 07-11-1956 Today's Date: 02/26/2022   History of Present Illness 66 year old male with a history of cholecystectomy, coronary artery disease, admitted to the hospital with sepsis secondary to community-acquired pneumonia and  acute kidney injury.    PT Comments    Pt is progressing well with mobility, he ambulated 2' with RW, no loss of balance. Distance limited by fatigue. Noted SpO2 was 90% on room air with ambulation with nursing earlier today.    Recommendations for follow up therapy are one component of a multi-disciplinary discharge planning process, led by the attending physician.  Recommendations may be updated based on patient status, additional functional criteria and insurance authorization.  Follow Up Recommendations  Home health PT     Assistance Recommended at Discharge Intermittent Supervision/Assistance  Patient can return home with the following Assistance with cooking/housework;Assist for transportation;Help with stairs or ramp for entrance   Equipment Recommendations  Rollator (4 wheels)    Recommendations for Other Services       Precautions / Restrictions Precautions Precautions: Fall Restrictions Weight Bearing Restrictions: No     Mobility  Bed Mobility Overal bed mobility: Needs Assistance Bed Mobility: Supine to Sit     Supine to sit: Min assist     General bed mobility comments: assist to elevate trunk;    Transfers Overall transfer level: Needs assistance Equipment used: Rolling walker (2 wheels) Transfers: Sit to/from Stand Sit to Stand: Min assist           General transfer comment: assist to rise and steady, VCs for hand placement    Ambulation/Gait   Gait Distance (Feet): 75 Feet Assistive device: Rolling walker (2 wheels) Gait Pattern/deviations: Step-through pattern, Decreased stride length Gait velocity: decr     General Gait Details:  steady with RW, no loss of balance, noted SaO2 was 90% on room air walking with nursing earlier today, distance limited by fatigue   Stairs             Wheelchair Mobility    Modified Rankin (Stroke Patients Only)       Balance Overall balance assessment: Needs assistance Sitting-balance support: No upper extremity supported, Feet supported Sitting balance-Leahy Scale: Fair     Standing balance support: Reliant on assistive device for balance Standing balance-Leahy Scale: Poor                              Cognition Arousal/Alertness: Awake/alert Behavior During Therapy: WFL for tasks assessed/performed Overall Cognitive Status: Within Functional Limits for tasks assessed                                          Exercises      General Comments        Pertinent Vitals/Pain Pain Assessment Pain Assessment: No/denies pain    Home Living                          Prior Function            PT Goals (current goals can now be found in the care plan section) Acute Rehab PT Goals 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, fix up antique trucks PT Goal Formulation: With patient/family Time For Goal Achievement: 03/11/22 Potential to Achieve Goals: Good Progress towards PT goals:  Progressing toward goals    Frequency    Min 3X/week      PT Plan Current plan remains appropriate    Co-evaluation              AM-PAC PT "6 Clicks" Mobility   Outcome Measure  Help needed turning from your back to your side while in a flat bed without using bedrails?: A Little Help needed moving from lying on your back to sitting on the side of a flat bed without using bedrails?: A Little Help needed moving to and from a bed to a chair (including a wheelchair)?: A Little Help needed standing up from a chair using your arms (e.g., wheelchair or bedside chair)?: A Little Help needed to walk in hospital room?: A Little Help needed climbing 3-5  steps with a railing? : A Little 6 Click Score: 18    End of Session   Activity Tolerance: Patient limited by fatigue Patient left: in chair;with call bell/phone within reach Nurse Communication: Mobility status PT Visit Diagnosis: Muscle weakness (generalized) (M62.81);Unsteadiness on feet (R26.81)     Time: 7858-8502 PT Time Calculation (min) (ACUTE ONLY): 15 min  Charges:  $Gait Training: 8-22 mins                    Blondell Reveal [place] PT 02/26/2022  Acute Rehabilitation Services Pager [phone] Office [phone]

## 2022-02-26 NOTE — Plan of Care (Signed)
  Problem: Education: Goal: Knowledge of General Education information will improve Description Including pain rating scale, medication(s)/side effects and non-pharmacologic comfort measures Outcome: Progressing   Problem: Health Behavior/Discharge Planning: Goal: Ability to manage health-related needs will improve Outcome: Progressing   

## 2022-02-26 NOTE — Plan of Care (Signed)
  Problem: Activity: Goal: Ability to tolerate increased activity will improve Outcome: Progressing   Problem: Clinical Measurements: Goal: Ability to maintain a body temperature in the normal range will improve Outcome: Progressing   Problem: Respiratory: Goal: Ability to maintain a clear airway will improve Outcome: Progressing   Problem: Education: Goal: Knowledge of General Education information will improve Description: Including pain rating scale, medication(s)/side effects and non-pharmacologic comfort measures Outcome: Progressing   Problem: Activity: Goal: Risk for activity intolerance will decrease Outcome: Progressing   Problem: Coping: Goal: Level of anxiety will decrease Outcome: Progressing   Problem: Elimination: Goal: Will not experience complications related to urinary retention Outcome: Progressing   Problem: Pain Managment: Goal: General experience of comfort will improve Outcome: Progressing   Problem: Safety: Goal: Ability to remain free from injury will improve Outcome: Progressing   Problem: Skin Integrity: Goal: Risk for impaired skin integrity will decrease Outcome: Progressing

## 2022-02-26 NOTE — Progress Notes (Addendum)
SATURATION QUALIFICATIONS: (This note is used to comply with regulatory documentation for home oxygen)  Patient Saturations on Room Air at Rest= 92%  Patient Saturations on Room Air while Ambulating = 90%  Patient Saturations on 0 Liters of oxygen while Ambulating = 94%  Please briefly explain why patient needs home oxygen: Jerene Pitch

## 2022-02-26 NOTE — Progress Notes (Signed)
Initial Nutrition Assessment  DOCUMENTATION CODES:   Obesity unspecified  INTERVENTION:  - continue Boost Breeze TID, each supplement provides 250 kcal and 9 grams of protein.  - complete NFPE when feasible.   NUTRITION DIAGNOSIS:   Increased nutrient needs related to acute illness as evidenced by estimated needs.  GOAL:   Patient will meet greater than or equal to 90% of their needs  MONITOR:   PO intake, Supplement acceptance, Labs, Weight trends  REASON FOR ASSESSMENT:   Malnutrition Screening Tool  ASSESSMENT:   65 year old male with medical history of cholecystectomy, CAD, GERD, sleep apnea, hypercholesterolemia, and high blood pressure. He was admitted due to sepsis 2/2 CAP and AKI. He was started on IV fluids and IV abx.  Patient working with PT at time of initial visit and being provided care by nursing staff at time of second attempted visit.   No meal intakes documented. Diet advanced from CLD (started 5/29 night) to Regular today at 0940. Boost Breeze ordered TID per ONS protocol on admission and patient has accepted all bottles of this supplement offered.   Weight on 5/29 was 219 lb and weight on 11/28/21 was 233 lb which indicates 14 lb weight loss (6% body weight) in the past ~3 months. No weight recordings between these dates.  Per notes: - sepsis  - CAP - AKI   Labs reviewed; K: 3.2 mmol/l, BUN: 40 mg/dl, creatinine: 1.56 mg/dl, Ca: 8.2 mg/dl, LFTs elevated and up from 5/30, GFR: 48 mg/dl.  Medications reviewed; 40 mg oral protonix BID, 40 mEq Klor-Con x1 dose 5/31, 1 g carafate BID PRN.  IVF; NS @ 100 ml/hr.    NUTRITION - FOCUSED PHYSICAL EXAM:  Unable.  Diet Order:   Diet Order             Diet regular Room service appropriate? Yes; Fluid consistency: Thin  Diet effective now                   EDUCATION NEEDS:   No education needs have been identified at this time  Skin:  Skin Assessment: Reviewed RN Assessment  Last BM:  5/31  (type 6 x1 and type 7 x1)  Height:   Ht Readings from Last 1 Encounters:  02/24/22 '5\' 6"'$  (1.676 m)    Weight:   Wt Readings from Last 1 Encounters:  02/24/22 99.5 kg     BMI:  Body mass index is 35.41 kg/m.  Estimated Nutritional Needs:  Kcal:  1900-2100 kcal Protein:  90-110 grams Fluid:  >/= 2 L/day     Jarome Matin, MS, RD, LDN Registered Dietitian II Inpatient Clinical Nutrition RD pager # and on-call/weekend pager # available in [facility]

## 2022-02-27 LAB — BASIC METABOLIC PANEL
Anion gap: 8 (ref 5–15)
BUN: 28 mg/dL — ABNORMAL HIGH (ref 8–23)
CO2: 21 mmol/L — ABNORMAL LOW (ref 22–32)
Calcium: 8.1 mg/dL — ABNORMAL LOW (ref 8.9–10.3)
Chloride: 108 mmol/L (ref 98–111)
Creatinine, Ser: 1.31 mg/dL — ABNORMAL HIGH (ref 0.61–1.24)
GFR, Estimated: 60 mL/min — ABNORMAL LOW (ref >60)
Glucose, Bld: 104 mg/dL — ABNORMAL HIGH (ref 70–99)
Potassium: 3.1 mmol/L — ABNORMAL LOW (ref 3.5–5.1)
Sodium: 137 mmol/L (ref 135–145)

## 2022-02-27 LAB — CBC
HCT: 36.2 % — ABNORMAL LOW (ref 39.0–52.0)
Hemoglobin: 12.4 g/dL — ABNORMAL LOW (ref 13.0–17.0)
MCH: 30.4 pg (ref 26.0–34.0)
MCHC: 34.3 g/dL (ref 30.0–36.0)
MCV: 88.7 fL (ref 80.0–100.0)
Platelets: 160 10*3/uL (ref 150–400)
RBC: 4.08 MIL/uL — ABNORMAL LOW (ref 4.22–5.81)
RDW: 14.7 % (ref 11.5–15.5)
WBC: 6.9 10*3/uL (ref 4.0–10.5)
nRBC: 0 % (ref 0.0–0.2)

## 2022-02-27 MED ORDER — POTASSIUM CHLORIDE CRYS ER 20 MEQ PO TBCR
40.0000 meq | EXTENDED_RELEASE_TABLET | ORAL | Status: AC
  Administered 2022-02-27 (×3): 40 meq via ORAL
  Filled 2022-02-27 (×3): qty 2

## 2022-02-27 MED ORDER — ALBUTEROL SULFATE (2.5 MG/3ML) 0.083% IN NEBU
2.5000 mg | INHALATION_SOLUTION | RESPIRATORY_TRACT | Status: DC | PRN
  Administered 2022-02-27: 2.5 mg via RESPIRATORY_TRACT
  Filled 2022-02-27: qty 3

## 2022-02-27 NOTE — Progress Notes (Signed)
PROGRESS NOTE    Stephen Nash  ZOX:[id number] DOB: November 28, 1955 DOA: 02/24/2022 PCP: Madison Hickman, FNP    Brief Narrative:  66 year old male with a history of cholecystectomy, coronary artery disease, admitted to the hospital with sepsis secondary to community-acquired pneumonia.  Also had acute kidney injury.  Started on IV fluids and IV antibiotics.  Very weak and slow to recover   Assessment & Plan:   Principal Problem:   CAP (community acquired pneumonia) Active Problems:   Sepsis ([place])   AKI (acute kidney injury) ([place])   Transaminitis   Flank pain   Elevated troponin   CAD S/P percutaneous coronary angioplasty   OSA (obstructive sleep apnea)   Hypokalemia   Sepsis -Noted to have leukocytosis, fever, tachycardia on admission -Source is pneumonia -Organ dysfunction includes AKI -Lactic acid elevated at 2.2 on admission, improved to 1.5 with IV fluids -Started on broad-spectrum antibiotics -blood cultures NGTD  Community-acquired pneumonia -Started on ceftriaxone and azithromycin -COVID-negative -Respiratory viral panel negative -Continue supportive measures -add nebs -flutter valve  Acute kidney injury -Holding ARB and diuretics -IVF overnight with improvement  Transaminitis -Transaminases noted to be normal range 07/2021 -Possibly secondary to sepsis -Status post cholecystectomy -RUQ U/S negative for CDB dilatation but does show fatty liver  Elevated troponin -No complaints of chest pain -Suspect is related to demand ischemia -Trend has been flat  OSA -Continue CPAP nightly  Hypokalemia -Replace  DVT prophylaxis: enoxaparin (LOVENOX) injection 40 mg Start: 02/24/22 2200  Code Status: Full code Family Communication: updated wife at bedside Disposition Plan: Status is: inpt      Subjective: Having trouble coughing up sputum  Objective: Vitals:   02/26/22 0525 02/26/22 1426 02/26/22 1944 02/26/22 2027  BP: (!) 142/62 103/74  (!) 104/55   Pulse: 86 93 96 78  Resp: '20 19 20 17  '$ Temp: 99.9 F (37.7 C) 98.7 F (37.1 C)  100 F (37.8 C)  TempSrc:      SpO2: 92% 94%  98%  Weight:      Height:        Intake/Output Summary (Last 24 hours) at 02/27/2022 1102 Last data filed at 02/27/2022 0300 Gross per 24 hour  Intake 350 ml  Output --  Net 350 ml   Filed Weights   02/24/22 1805 02/24/22 2135  Weight: 104.3 kg 99.5 kg    Examination:   General: Appearance:    Obese male in no acute distress     Lungs:      respirations unlabored, occasional wheeze  Heart:    Normal heart rate. Normal rhythm. No murmurs, rubs, or gallops.   MS:   All extremities are intact.   Neurologic:   Awake, alert      Data Reviewed: I have personally reviewed following labs and imaging studies  CBC: Recent Labs  Lab 02/21/22 1624 02/24/22 1848 02/25/22 0400 02/26/22 0452 02/27/22 0757  WBC 11.0* 13.6* 12.7* 8.6 6.9  NEUTROABS 8.8* 11.5*  --   --   --   HGB 15.7 17.3* 14.2 13.8 12.4*  HCT 45.3 48.3 41.9 39.5 36.2*  MCV 89.9 87.3 89.3 88.8 88.7  PLT 172 181 153 148* 811   Basic Metabolic Panel: Recent Labs  Lab 02/21/22 1624 02/24/22 1848 02/25/22 0400 02/26/22 0452 02/27/22 0757  NA 138 131* 132* 136 137  K 3.9 3.1* 3.4* 3.2* 3.1*  CL 103 95* 102 103 108  CO2 26 23 21* 21* 21*  GLUCOSE 138* 133* 108* 109* 104*  BUN  22 40* 40* 40* 28*  CREATININE 1.31* 2.09* 1.66* 1.56* 1.31*  CALCIUM 9.0 8.8* 8.0* 8.2* 8.1*   GFR: Estimated Creatinine Clearance: 60.4 mL/min (A) (by C-G formula based on SCr of 1.31 mg/dL (H)). Liver Function Tests: Recent Labs  Lab 02/24/22 1848 02/25/22 0400 02/26/22 0452  AST 163* 165* 279*  ALT 92* 94* 164*  ALKPHOS 105 86 94  BILITOT 1.6* 1.3* 0.9  PROT 8.4* 6.1* 6.0*  ALBUMIN 3.7 2.8* 2.7*   Recent Labs  Lab 02/24/22 1848  LIPASE 102*   No results for input(s): AMMONIA in the last 168 hours. Coagulation Profile: Recent Labs  Lab 02/24/22 1848  INR 1.1   Cardiac Enzymes: No  results for input(s): CKTOTAL, CKMB, CKMBINDEX, TROPONINI in the last 168 hours. BNP (last 3 results) No results for input(s): PROBNP in the last 8760 hours. HbA1C: No results for input(s): HGBA1C in the last 72 hours. CBG: Recent Labs  Lab 02/24/22 1935  GLUCAP 126*   Lipid Profile: No results for input(s): CHOL, HDL, LDLCALC, TRIG, CHOLHDL, LDLDIRECT in the last 72 hours. Thyroid Function Tests: No results for input(s): TSH, T4TOTAL, FREET4, T3FREE, THYROIDAB in the last 72 hours. Anemia Panel: No results for input(s): VITAMINB12, FOLATE, FERRITIN, TIBC, IRON, RETICCTPCT in the last 72 hours. Sepsis Labs: Recent Labs  Lab 02/24/22 1848 02/24/22 2303  LATICACIDVEN 2.2* 1.5    Recent Results (from the past 240 hour(s))  Resp Panel by RT-PCR (Flu A&B, Covid) Anterior Nasal Swab     Status: None   Collection Time: 02/21/22  6:44 PM   Specimen: Anterior Nasal Swab  Result Value Ref Range Status   SARS Coronavirus 2 by RT PCR NEGATIVE NEGATIVE Final    Comment: (NOTE) SARS-CoV-2 target nucleic acids are NOT DETECTED.  The SARS-CoV-2 RNA is generally detectable in upper respiratory specimens during the acute phase of infection. The lowest concentration of SARS-CoV-2 viral copies this assay can detect is 138 copies/mL. A negative result does not preclude SARS-Cov-2 infection and should not be used as the sole basis for treatment or other patient management decisions. A negative result may occur with  improper specimen collection/handling, submission of specimen other than nasopharyngeal swab, presence of viral mutation(s) within the areas targeted by this assay, and inadequate number of viral copies(<138 copies/mL). A negative result must be combined with clinical observations, patient history, and epidemiological information. The expected result is Negative.  Fact Sheet for Patients:  EntrepreneurPulse.com.au  Fact Sheet for Healthcare Providers:   IncredibleEmployment.be  This test is no t yet approved or cleared by the [place] FDA and  has been authorized for detection and/or diagnosis of SARS-CoV-2 by FDA under an Emergency Use Authorization (EUA). This EUA will remain  in effect (meaning this test can be used) for the duration of the COVID-19 declaration under Section 564(b)(1) of the Act, 21 U.S.C.section 360bbb-3(b)(1), unless the authorization is terminated  or revoked sooner.       Influenza A by PCR NEGATIVE NEGATIVE Final   Influenza B by PCR NEGATIVE NEGATIVE Final    Comment: (NOTE) The Xpert Xpress SARS-CoV-2/FLU/RSV plus assay is intended as an aid in the diagnosis of influenza from Nasopharyngeal swab specimens and should not be used as a sole basis for treatment. Nasal washings and aspirates are unacceptable for Xpert Xpress SARS-CoV-2/FLU/RSV testing.  Fact Sheet for Patients: EntrepreneurPulse.com.au  Fact Sheet for Healthcare Providers: IncredibleEmployment.be  This test is not yet approved or cleared by the [place] FDA and has  been authorized for detection and/or diagnosis of SARS-CoV-2 by FDA under an Emergency Use Authorization (EUA). This EUA will remain in effect (meaning this test can be used) for the duration of the COVID-19 declaration under Section 564(b)(1) of the Act, 21 U.S.C. section 360bbb-3(b)(1), unless the authorization is terminated or revoked.  Performed at [facility], [place] [address], [place], [place] [address]   Culture, blood (routine x 2)     Status: None (Preliminary result)   Collection Time: 02/24/22  6:25 PM   Specimen: BLOOD  Result Value Ref Range Status   Specimen Description   Final    BLOOD BLOOD RIGHT HAND Performed at [facility] [address], [place], [place] [address]    Special Requests   Final    BOTTLES DRAWN AEROBIC AND ANAEROBIC Blood Culture  results may not be optimal due to an inadequate volume of blood received in culture bottles Performed at [facility] [address] [place], [place] [address]    Culture   Final    NO GROWTH 2 DAYS Performed at [facility] [address] [place], [place] [address]    Report Status PENDING  Incomplete  Resp Panel by RT-PCR (Flu A&B, Covid) Anterior Nasal Swab     Status: None   Collection Time: 02/24/22  6:48 PM   Specimen: Anterior Nasal Swab  Result Value Ref Range Status   SARS Coronavirus 2 by RT PCR NEGATIVE NEGATIVE Final    Comment: (NOTE) SARS-CoV-2 target nucleic acids are NOT DETECTED.  The SARS-CoV-2 RNA is generally detectable in upper respiratory specimens during the acute phase of infection. The lowest concentration of SARS-CoV-2 viral copies this assay can detect is 138 copies/mL. A negative result does not preclude SARS-Cov-2 infection and should not be used as the sole basis for treatment or other patient management decisions. A negative result may occur with  improper specimen collection/handling, submission of specimen other than nasopharyngeal swab, presence of viral mutation(s) within the areas targeted by this assay, and inadequate number of viral copies(<138 copies/mL). A negative result must be combined with clinical observations, patient history, and epidemiological information. The expected result is Negative.  Fact Sheet for Patients:  EntrepreneurPulse.com.au  Fact Sheet for Healthcare Providers:  IncredibleEmployment.be  This test is no t yet approved or cleared by the [place] FDA and  has been authorized for detection and/or diagnosis of SARS-CoV-2 by FDA under an Emergency Use Authorization (EUA). This EUA will remain  in effect (meaning this test can be used) for the duration of the COVID-19 declaration under Section 564(b)(1) of the Act, 21 U.S.C.section 360bbb-3(b)(1),  unless the authorization is terminated  or revoked sooner.       Influenza A by PCR NEGATIVE NEGATIVE Final   Influenza B by PCR NEGATIVE NEGATIVE Final    Comment: (NOTE) The Xpert Xpress SARS-CoV-2/FLU/RSV plus assay is intended as an aid in the diagnosis of influenza from Nasopharyngeal swab specimens and should not be used as a sole basis for treatment. Nasal washings and aspirates are unacceptable for Xpert Xpress SARS-CoV-2/FLU/RSV testing.  Fact Sheet for Patients: EntrepreneurPulse.com.au  Fact Sheet for Healthcare Providers: IncredibleEmployment.be  This test is not yet approved or cleared by the [place] FDA and has been authorized for detection and/or diagnosis of SARS-CoV-2 by FDA under an Emergency Use Authorization (EUA). This EUA will remain in effect (meaning this test can be used) for the duration of the COVID-19 declaration under  Section 564(b)(1) of the Act, 21 U.S.C. section 360bbb-3(b)(1), unless the authorization is terminated or revoked.  Performed at [facility], [place] [address] [place], [place] [address]   Culture, blood (routine x 2)     Status: None (Preliminary result)   Collection Time: 02/24/22  6:48 PM   Specimen: BLOOD  Result Value Ref Range Status   Specimen Description   Final    BLOOD LEFT ANTECUBITAL Performed at [facility] [address], [place], [place] [address]    Special Requests   Final    BOTTLES DRAWN AEROBIC AND ANAEROBIC Blood Culture adequate volume Performed at [facility] [address] [place], [place] [address]    Culture   Final    NO GROWTH 2 DAYS Performed at [facility] [address] [place], [place] [address]    Report Status PENDING  Incomplete  Respiratory (~20 pathogens) panel by PCR     Status: None   Collection Time: 02/24/22 11:06 PM   Specimen: Nasopharyngeal Swab; Respiratory  Result Value  Ref Range Status   Adenovirus NOT DETECTED NOT DETECTED Final   Coronavirus 229E NOT DETECTED NOT DETECTED Final    Comment: (NOTE) The Coronavirus on the Respiratory Panel, DOES NOT test for the novel  Coronavirus (2019 nCoV)    Coronavirus HKU1 NOT DETECTED NOT DETECTED Final   Coronavirus NL63 NOT DETECTED NOT DETECTED Final   Coronavirus OC43 NOT DETECTED NOT DETECTED Final   Metapneumovirus NOT DETECTED NOT DETECTED Final   Rhinovirus / Enterovirus NOT DETECTED NOT DETECTED Final   Influenza A NOT DETECTED NOT DETECTED Final   Influenza B NOT DETECTED NOT DETECTED Final   Parainfluenza Virus 1 NOT DETECTED NOT DETECTED Final   Parainfluenza Virus 2 NOT DETECTED NOT DETECTED Final   Parainfluenza Virus 3 NOT DETECTED NOT DETECTED Final   Parainfluenza Virus 4 NOT DETECTED NOT DETECTED Final   Respiratory Syncytial Virus NOT DETECTED NOT DETECTED Final   Bordetella pertussis NOT DETECTED NOT DETECTED Final   Bordetella Parapertussis NOT DETECTED NOT DETECTED Final   Chlamydophila pneumoniae NOT DETECTED NOT DETECTED Final   Mycoplasma pneumoniae NOT DETECTED NOT DETECTED Final    Comment: Performed at [facility], [place]. [address], [place], [place] [address]  MRSA Next Gen by PCR, Nasal     Status: None   Collection Time: 02/24/22 11:07 PM   Specimen: Nasal Mucosa; Nasal Swab  Result Value Ref Range Status   MRSA by PCR Next Gen NOT DETECTED NOT DETECTED Final    Comment: (NOTE) The GeneXpert MRSA Assay (FDA approved for NASAL specimens only), is one component of a comprehensive MRSA colonization surveillance program. It is not intended to diagnose MRSA infection nor to guide or monitor treatment for MRSA infections. Test performance is not FDA approved in patients less than 43 years old. Performed at [facility], [place] [address] [place],  [address]   C Difficile Quick Screen w PCR reflex     Status: None   Collection Time: 02/25/22  4:39 PM    Specimen: STOOL  Result Value Ref Range Status   C Diff antigen NEGATIVE NEGATIVE Final   C Diff toxin NEGATIVE NEGATIVE Final   C Diff interpretation No C. difficile detected.  Final    Comment: Performed at [facility], [place] [address], [place],  [address]         Radiology Studies: US Abdomen Limited RUQ (  LIVER/GB)  Result Date: 02/25/2022 CLINICAL DATA:  Elevated liver function tests in a 66 year old male. EXAM: ULTRASOUND ABDOMEN LIMITED RIGHT UPPER QUADRANT COMPARISON:  CT renal stone protocol from Feb 21, 2022. FINDINGS: Gallbladder: Post cholecystectomy. Common bile duct: Diameter: 4.5 mm Liver: Liver with increased echogenicity and coarsened hepatic echotexture. No visible lesion on limited imaging due to patient body habitus and sonographic window. Portal vein is patent on color Doppler imaging with normal direction of blood flow towards the liver. Other: No perihepatic ascites. IMPRESSION: Limited exam due to patient body habitus with no biliary duct dilation following cholecystectomy. Hepatic steatosis and coarsened hepatic echotexture, correlate with any clinical or laboratory evidence of liver disease. Electronically Signed   By: Zetta Bills M.D.   On: 02/25/2022 16:24        Scheduled Meds:  allopurinol  300 mg Oral Daily   aspirin EC  81 mg Oral Daily   chlorhexidine  15 mL Mouth Rinse BID   enoxaparin (LOVENOX) injection  40 mg Subcutaneous Q24H   feeding supplement  1 Container Oral TID BM   mouth rinse  15 mL Mouth Rinse BID   pantoprazole  40 mg Oral BID   potassium chloride  40 mEq Oral Q4H   Continuous Infusions:  azithromycin 500 mg (02/26/22 1817)   cefTRIAXone (ROCEPHIN)  IV 2 g (02/26/22 2118)     LOS: 2 days    Time spent: 66mns    Nelline Lio U Eboney Claybrook, DO [facility]   If 7PM-7AM, please contact night-coverage [email or web address]  02/27/2022, 11:02 AM

## 2022-02-28 LAB — COMPREHENSIVE METABOLIC PANEL
ALT: 156 U/L — ABNORMAL HIGH (ref 0–44)
AST: 143 U/L — ABNORMAL HIGH (ref 15–41)
Albumin: 2.6 g/dL — ABNORMAL LOW (ref 3.5–5.0)
Alkaline Phosphatase: 98 U/L (ref 38–126)
Anion gap: 6 (ref 5–15)
BUN: 19 mg/dL (ref 8–23)
CO2: 21 mmol/L — ABNORMAL LOW (ref 22–32)
Calcium: 8.3 mg/dL — ABNORMAL LOW (ref 8.9–10.3)
Chloride: 113 mmol/L — ABNORMAL HIGH (ref 98–111)
Creatinine, Ser: 1.05 mg/dL (ref 0.61–1.24)
GFR, Estimated: >60 mL/min (ref >60)
Glucose, Bld: 109 mg/dL — ABNORMAL HIGH (ref 70–99)
Potassium: 3.4 mmol/L — ABNORMAL LOW (ref 3.5–5.1)
Sodium: 140 mmol/L (ref 135–145)
Total Bilirubin: 0.7 mg/dL (ref 0.3–1.2)
Total Protein: 6.2 g/dL — ABNORMAL LOW (ref 6.5–8.1)

## 2022-02-28 LAB — MAGNESIUM: Magnesium: 2.4 mg/dL (ref 1.7–2.4)

## 2022-02-28 MED ORDER — DOXYCYCLINE HYCLATE 100 MG PO TABS: 100.0000 mg | ORAL_TABLET | Freq: Two times a day (BID) | ORAL | 0 refills | Status: DC

## 2022-02-28 MED ORDER — TORSEMIDE 10 MG PO TABS: 5.0000 mg | ORAL_TABLET | Freq: Every day | ORAL | Status: AC

## 2022-02-28 MED ORDER — ATORVASTATIN CALCIUM 80 MG PO TABS: 80.0000 mg | ORAL_TABLET | Freq: Every evening | ORAL | Status: DC

## 2022-02-28 MED ORDER — POTASSIUM CHLORIDE CRYS ER 20 MEQ PO TBCR
40.0000 meq | EXTENDED_RELEASE_TABLET | Freq: Once | ORAL | Status: AC
  Administered 2022-02-28: 40 meq via ORAL
  Filled 2022-02-28: qty 2

## 2022-02-28 MED ORDER — DOXYCYCLINE HYCLATE 100 MG PO TABS
100.0000 mg | ORAL_TABLET | Freq: Two times a day (BID) | ORAL | Status: DC
  Administered 2022-02-28: 100 mg via ORAL
  Filled 2022-02-28: qty 1

## 2022-02-28 MED ORDER — PANTOPRAZOLE SODIUM 40 MG PO TBEC: 40.0000 mg | DELAYED_RELEASE_TABLET | Freq: Two times a day (BID) | ORAL | Status: DC

## 2022-02-28 MED ORDER — ACIDOPHILUS PO CAPS: 1.0000 | ORAL_CAPSULE | Freq: Every day | ORAL | 0 refills | Status: DC

## 2022-02-28 NOTE — Plan of Care (Signed)
  Problem: Activity: Goal: Ability to tolerate increased activity will improve Outcome: Adequate for Discharge   Problem: Clinical Measurements: Goal: Ability to maintain a body temperature in the normal range will improve Outcome: Adequate for Discharge   Problem: Respiratory: Goal: Ability to maintain adequate ventilation will improve Outcome: Adequate for Discharge Goal: Ability to maintain a clear airway will improve Outcome: Adequate for Discharge   Problem: Education: Goal: Knowledge of General Education information will improve Description: Including pain rating scale, medication(s)/side effects and non-pharmacologic comfort measures 02/28/2022 1148 by Jerene Pitch, RN Outcome: Adequate for Discharge 02/28/2022 1140 by Jerene Pitch, RN Outcome: Progressing   Problem: Health Behavior/Discharge Planning: Goal: Ability to manage health-related needs will improve 02/28/2022 1148 by Jerene Pitch, RN Outcome: Adequate for Discharge 02/28/2022 1140 by Jerene Pitch, RN Outcome: Progressing   Problem: Clinical Measurements: Goal: Ability to maintain clinical measurements within normal limits will improve Outcome: Adequate for Discharge Goal: Will remain free from infection Outcome: Adequate for Discharge Goal: Diagnostic test results will improve Outcome: Adequate for Discharge Goal: Respiratory complications will improve Outcome: Adequate for Discharge Goal: Cardiovascular complication will be avoided Outcome: Adequate for Discharge   Problem: Activity: Goal: Risk for activity intolerance will decrease Outcome: Adequate for Discharge   Problem: Nutrition: Goal: Adequate nutrition will be maintained Outcome: Adequate for Discharge   Problem: Coping: Goal: Level of anxiety will decrease Outcome: Adequate for Discharge   Problem: Elimination: Goal: Will not experience complications related to bowel motility Outcome: Adequate for Discharge Goal: Will not  experience complications related to urinary retention Outcome: Adequate for Discharge   Problem: Pain Managment: Goal: General experience of comfort will improve Outcome: Adequate for Discharge   Problem: Safety: Goal: Ability to remain free from injury will improve Outcome: Adequate for Discharge   Problem: Skin Integrity: Goal: Risk for impaired skin integrity will decrease Outcome: Adequate for Discharge   Problem: Acute Rehab PT Goals(only PT should resolve) Goal: Pt Will Go Supine/Side To Sit Outcome: Adequate for Discharge Goal: Patient Will Transfer Sit To/From Stand Outcome: Adequate for Discharge Goal: Pt Will Ambulate Outcome: Adequate for Discharge   Problem: Increased Nutrient Needs (NI-5.1) Goal: Food and/or nutrient delivery Description: Individualized approach for food/nutrient provision. Outcome: Adequate for Discharge

## 2022-02-28 NOTE — Plan of Care (Signed)
  Problem: Education: Goal: Knowledge of General Education information will improve Description Including pain rating scale, medication(s)/side effects and non-pharmacologic comfort measures Outcome: Progressing   Problem: Health Behavior/Discharge Planning: Goal: Ability to manage health-related needs will improve Outcome: Progressing   

## 2022-02-28 NOTE — TOC Transition Note (Signed)
Transition of Care [facility]) - CM/SW Discharge Note   Patient Details  Name: Stephen Nash MRN: [id number] Date of Birth: 18-Oct-1955  Transition of Care [facility]) CM/SW Contact:  Leeroy Cha, RN Phone Number: 02/28/2022, 10:41 AM   Clinical Narrative:    DCD to home with no toc needs.   Final next level of care: Home/Self Care Barriers to Discharge: No Barriers Identified   Patient Goals and CMS Choice Patient states their goals for this hospitalization and ongoing recovery are:: to go home CMS Medicare.gov Compare Post Acute Care list provided to:: Patient Choice offered to / list presented to : Patient  Discharge Placement                       Discharge Plan and Services   Discharge Planning Services: CM Consult                                 Social Determinants of Health (SDOH) Interventions     Readmission Risk Interventions     View : No data to display.

## 2022-02-28 NOTE — Discharge Summary (Signed)
Physician Discharge Summary  Yancy Knoble JOI:[id number] DOB: 06-11-1956 DOA: 02/24/2022  PCP: Madison Hickman, FNP  Admit date: 02/24/2022 Discharge date: 02/28/2022  Admitted From: home Discharge disposition: home   Recommendations for Outpatient Follow-Up:   BMp 1 week Adjust BP medications X ray in 6 weeks to ensure resolution   Discharge Diagnosis:   Principal Problem:   CAP (community acquired pneumonia) Active Problems:   Sepsis ([place])   AKI (acute kidney injury) ([place])   Transaminitis   Flank pain   Elevated troponin   CAD S/P percutaneous coronary angioplasty   OSA (obstructive sleep apnea)   Hypokalemia    Discharge Condition: Improved.  Diet recommendation: Low sodium, heart health  Wound care: None.  Code status: Full.   History of Present Illness:   Stephen Nash is a 66 y.o. male with medical history significant of CAD s/p stent, cholecystectomy.   Pt ill for about 3 weeks with cough, flank pain, testicular pain.   Seen in ED 3 days ago with R flank pain and testicular pain.  He had a CT renal and testicular ultrasound.  No clear diagnosis identified.  He said he since has been home he has felt progressively weak all over and with increased shortness of breath.  He denies any chest pain.  He does endorse a headache.  Decreased appetite although drinking a lot of fluids.  Urinating frequently although wife says that is normal for him.  Had a low-grade temperature.   Hospital Course by Problem:   Sepsis -Noted to have leukocytosis, fever, tachycardia on admission -Source is pneumonia -Organ dysfunction includes AKI -Lactic acid resolved -Started on broad-spectrum antibiotics -blood cultures NGTD   Community-acquired pneumonia -Started on ceftriaxone and azithromycin -COVID-negative -Respiratory viral panel negative -Continue supportive measures  -flutter valve   Acute kidney injury -resolved   Transaminitis -Transaminases noted to be  normal range 07/2021 -Possibly secondary to sepsis -Status post cholecystectomy -RUQ U/S negative for CDB dilatation but does show fatty liver   Elevated troponin -No complaints of chest pain -Suspect is related to demand ischemia -Trend has been flat   OSA -Continue CPAP nightly   Hypokalemia -Replace    Medical Consultants:      Discharge Exam:   Vitals:   02/27/22 1357 02/27/22 2124  BP:  111/63  Pulse: 80 84  Resp: 18 18  Temp:  99.1 F (37.3 C)  SpO2:  97%   Vitals:   02/26/22 2027 02/27/22 1334 02/27/22 1357 02/27/22 2124  BP: (!) 104/55 115/84  111/63  Pulse: 78 79 80 84  Resp: 17 (!) '22 18 18  '$ Temp: 100 F (37.8 C) 98.2 F (36.8 C)  99.1 F (37.3 C)  TempSrc:  Oral  Oral  SpO2: 98% 98%  97%  Weight:      Height:        General exam: Appears calm and comfortable.    The results of significant diagnostics from this hospitalization (including imaging, microbiology, ancillary and laboratory) are listed below for reference.     Procedures and Diagnostic Studies:   DG Chest Port 1 View  Result Date: 02/24/2022 CLINICAL DATA:  Shortness of breath weakness and fever. Productive cough with white sputum. EXAM: PORTABLE CHEST 1 VIEW COMPARISON:  AP chest 08/23/2021 FINDINGS: Cardiac silhouette and mediastinal contours within normal limits with mild calcification within aortic arch. New heterogeneous airspace opacification throughout the superolateral left lung. The right lung remains clear. No pleural effusion or pneumothorax. Mild-to-moderate  multilevel degenerative disc changes of the thoracic spine. Mild dextrocurvature of the midthoracic spine. IMPRESSION: New heterogeneous airspace opacification throughout the superolateral left lung suspicious for pneumonia. Recommend follow-up radiographs 6 weeks after treatment to ensure resolution. Electronically Signed   By: Yvonne Kendall M.D.   On: 02/24/2022 18:40   US Abdomen Limited RUQ (LIVER/GB)  Result  Date: 02/25/2022 CLINICAL DATA:  Elevated liver function tests in a 66 year old male. EXAM: ULTRASOUND ABDOMEN LIMITED RIGHT UPPER QUADRANT COMPARISON:  CT renal stone protocol from Feb 21, 2022. FINDINGS: Gallbladder: Post cholecystectomy. Common bile duct: Diameter: 4.5 mm Liver: Liver with increased echogenicity and coarsened hepatic echotexture. No visible lesion on limited imaging due to patient body habitus and sonographic window. Portal vein is patent on color Doppler imaging with normal direction of blood flow towards the liver. Other: No perihepatic ascites. IMPRESSION: Limited exam due to patient body habitus with no biliary duct dilation following cholecystectomy. Hepatic steatosis and coarsened hepatic echotexture, correlate with any clinical or laboratory evidence of liver disease. Electronically Signed   By: Zetta Bills M.D.   On: 02/25/2022 16:24     Labs:   Basic Metabolic Panel: Recent Labs  Lab 02/24/22 1848 02/25/22 0400 02/26/22 0452 02/27/22 0757 02/28/22 0420  NA 131* 132* 136 137 140  K 3.1* 3.4* 3.2* 3.1* 3.4*  CL 95* 102 103 108 113*  CO2 23 21* 21* 21* 21*  GLUCOSE 133* 108* 109* 104* 109*  BUN 40* 40* 40* 28* 19  CREATININE 2.09* 1.66* 1.56* 1.31* 1.05  CALCIUM 8.8* 8.0* 8.2* 8.1* 8.3*  MG  --   --   --   --  2.4   GFR Estimated Creatinine Clearance: 75.4 mL/min (by C-G formula based on SCr of 1.05 mg/dL). Liver Function Tests: Recent Labs  Lab 02/24/22 1848 02/25/22 0400 02/26/22 0452 02/28/22 0420  AST 163* 165* 279* 143*  ALT 92* 94* 164* 156*  ALKPHOS 105 86 94 98  BILITOT 1.6* 1.3* 0.9 0.7  PROT 8.4* 6.1* 6.0* 6.2*  ALBUMIN 3.7 2.8* 2.7* 2.6*   Recent Labs  Lab 02/24/22 1848  LIPASE 102*   No results for input(s): AMMONIA in the last 168 hours. Coagulation profile Recent Labs  Lab 02/24/22 1848  INR 1.1    CBC: Recent Labs  Lab 02/21/22 1624 02/24/22 1848 02/25/22 0400 02/26/22 0452 02/27/22 0757  WBC 11.0* 13.6* 12.7*  8.6 6.9  NEUTROABS 8.8* 11.5*  --   --   --   HGB 15.7 17.3* 14.2 13.8 12.4*  HCT 45.3 48.3 41.9 39.5 36.2*  MCV 89.9 87.3 89.3 88.8 88.7  PLT 172 181 153 148* 160   Cardiac Enzymes: No results for input(s): CKTOTAL, CKMB, CKMBINDEX, TROPONINI in the last 168 hours. BNP: Invalid input(s): POCBNP CBG: Recent Labs  Lab 02/24/22 1935  GLUCAP 126*   D-Dimer No results for input(s): DDIMER in the last 72 hours. Hgb A1c No results for input(s): HGBA1C in the last 72 hours. Lipid Profile No results for input(s): CHOL, HDL, LDLCALC, TRIG, CHOLHDL, LDLDIRECT in the last 72 hours. Thyroid function studies No results for input(s): TSH, T4TOTAL, T3FREE, THYROIDAB in the last 72 hours.  Invalid input(s): FREET3 Anemia work up No results for input(s): VITAMINB12, FOLATE, FERRITIN, TIBC, IRON, RETICCTPCT in the last 72 hours. Microbiology Recent Results (from the past 240 hour(s))  Resp Panel by RT-PCR (Flu A&B, Covid) Anterior Nasal Swab     Status: None   Collection Time: 02/21/22  6:44 PM  Specimen: Anterior Nasal Swab  Result Value Ref Range Status   SARS Coronavirus 2 by RT PCR NEGATIVE NEGATIVE Final    Comment: (NOTE) SARS-CoV-2 target nucleic acids are NOT DETECTED.  The SARS-CoV-2 RNA is generally detectable in upper respiratory specimens during the acute phase of infection. The lowest concentration of SARS-CoV-2 viral copies this assay can detect is 138 copies/mL. A negative result does not preclude SARS-Cov-2 infection and should not be used as the sole basis for treatment or other patient management decisions. A negative result may occur with  improper specimen collection/handling, submission of specimen other than nasopharyngeal swab, presence of viral mutation(s) within the areas targeted by this assay, and inadequate number of viral copies(<138 copies/mL). A negative result must be combined with clinical observations, patient history, and  epidemiological information. The expected result is Negative.  Fact Sheet for Patients:  EntrepreneurPulse.com.au  Fact Sheet for Healthcare Providers:  IncredibleEmployment.be  This test is no t yet approved or cleared by the [place] FDA and  has been authorized for detection and/or diagnosis of SARS-CoV-2 by FDA under an Emergency Use Authorization (EUA). This EUA will remain  in effect (meaning this test can be used) for the duration of the COVID-19 declaration under Section 564(b)(1) of the Act, 21 U.S.C.section 360bbb-3(b)(1), unless the authorization is terminated  or revoked sooner.       Influenza A by PCR NEGATIVE NEGATIVE Final   Influenza B by PCR NEGATIVE NEGATIVE Final    Comment: (NOTE) The Xpert Xpress SARS-CoV-2/FLU/RSV plus assay is intended as an aid in the diagnosis of influenza from Nasopharyngeal swab specimens and should not be used as a sole basis for treatment. Nasal washings and aspirates are unacceptable for Xpert Xpress SARS-CoV-2/FLU/RSV testing.  Fact Sheet for Patients: EntrepreneurPulse.com.au  Fact Sheet for Healthcare Providers: IncredibleEmployment.be  This test is not yet approved or cleared by the [place] FDA and has been authorized for detection and/or diagnosis of SARS-CoV-2 by FDA under an Emergency Use Authorization (EUA). This EUA will remain in effect (meaning this test can be used) for the duration of the COVID-19 declaration under Section 564(b)(1) of the Act, 21 U.S.C. section 360bbb-3(b)(1), unless the authorization is terminated or revoked.  Performed at [facility], [place] [address], [place], [place] [address]   Culture, blood (routine x 2)     Status: None (Preliminary result)   Collection Time: 02/24/22  6:25 PM   Specimen: BLOOD  Result Value Ref Range Status   Specimen Description   Final    BLOOD BLOOD RIGHT  HAND Performed at [facility] [address], [place], [place] [address]    Special Requests   Final    BOTTLES DRAWN AEROBIC AND ANAEROBIC Blood Culture results may not be optimal due to an inadequate volume of blood received in culture bottles Performed at [facility] [address], [place], [place] [address]    Culture   Final    NO GROWTH 3 DAYS Performed at [facility], [place] [address], [place], [place] [address]    Report Status PENDING  Incomplete  Resp Panel by RT-PCR (Flu A&B, Covid) Anterior Nasal Swab     Status: None   Collection Time: 02/24/22  6:48 PM   Specimen: Anterior Nasal Swab  Result Value Ref Range Status   SARS Coronavirus 2 by RT PCR NEGATIVE NEGATIVE Final    Comment: (NOTE) SARS-CoV-2 target nucleic acids are NOT DETECTED.  The SARS-CoV-2  RNA is generally detectable in upper respiratory specimens during the acute phase of infection. The lowest concentration of SARS-CoV-2 viral copies this assay can detect is 138 copies/mL. A negative result does not preclude SARS-Cov-2 infection and should not be used as the sole basis for treatment or other patient management decisions. A negative result may occur with  improper specimen collection/handling, submission of specimen other than nasopharyngeal swab, presence of viral mutation(s) within the areas targeted by this assay, and inadequate number of viral copies(<138 copies/mL). A negative result must be combined with clinical observations, patient history, and epidemiological information. The expected result is Negative.  Fact Sheet for Patients:  EntrepreneurPulse.com.au  Fact Sheet for Healthcare Providers:  IncredibleEmployment.be  This test is no t yet approved or cleared by the [place] FDA and  has been authorized for detection and/or diagnosis of SARS-CoV-2 by FDA under an Emergency Use Authorization (EUA). This  EUA will remain  in effect (meaning this test can be used) for the duration of the COVID-19 declaration under Section 564(b)(1) of the Act, 21 U.S.C.section 360bbb-3(b)(1), unless the authorization is terminated  or revoked sooner.       Influenza A by PCR NEGATIVE NEGATIVE Final   Influenza B by PCR NEGATIVE NEGATIVE Final    Comment: (NOTE) The Xpert Xpress SARS-CoV-2/FLU/RSV plus assay is intended as an aid in the diagnosis of influenza from Nasopharyngeal swab specimens and should not be used as a sole basis for treatment. Nasal washings and aspirates are unacceptable for Xpert Xpress SARS-CoV-2/FLU/RSV testing.  Fact Sheet for Patients: EntrepreneurPulse.com.au  Fact Sheet for Healthcare Providers: IncredibleEmployment.be  This test is not yet approved or cleared by the [place] FDA and has been authorized for detection and/or diagnosis of SARS-CoV-2 by FDA under an Emergency Use Authorization (EUA). This EUA will remain in effect (meaning this test can be used) for the duration of the COVID-19 declaration under Section 564(b)(1) of the Act, 21 U.S.C. section 360bbb-3(b)(1), unless the authorization is terminated or revoked.  Performed at [facility], [place] [address] [place], [place] [address]   Culture, blood (routine x 2)     Status: None (Preliminary result)   Collection Time: 02/24/22  6:48 PM   Specimen: BLOOD  Result Value Ref Range Status   Specimen Description   Final    BLOOD LEFT ANTECUBITAL Performed at [facility] [address], [place], [place] [address]    Special Requests   Final    BOTTLES DRAWN AEROBIC AND ANAEROBIC Blood Culture adequate volume Performed at [facility] [address] [place], [place] [address]    Culture   Final    NO GROWTH 3 DAYS Performed at [facility], [place] [address] [place], [place] [address]    Report Status  PENDING  Incomplete  Respiratory (~20 pathogens) panel by PCR     Status: None   Collection Time: 02/24/22 11:06 PM   Specimen: Nasopharyngeal Swab; Respiratory  Result Value Ref Range Status   Adenovirus NOT DETECTED NOT DETECTED Final   Coronavirus 229E NOT DETECTED NOT DETECTED Final    Comment: (NOTE) The Coronavirus on the Respiratory Panel, DOES NOT test for the novel  Coronavirus (2019 nCoV)    Coronavirus HKU1 NOT DETECTED NOT DETECTED Final   Coronavirus NL63 NOT DETECTED NOT DETECTED Final   Coronavirus OC43 NOT DETECTED NOT DETECTED Final   Metapneumovirus NOT DETECTED NOT DETECTED Final   Rhinovirus / Enterovirus NOT DETECTED NOT  DETECTED Final   Influenza A NOT DETECTED NOT DETECTED Final   Influenza B NOT DETECTED NOT DETECTED Final   Parainfluenza Virus 1 NOT DETECTED NOT DETECTED Final   Parainfluenza Virus 2 NOT DETECTED NOT DETECTED Final   Parainfluenza Virus 3 NOT DETECTED NOT DETECTED Final   Parainfluenza Virus 4 NOT DETECTED NOT DETECTED Final   Respiratory Syncytial Virus NOT DETECTED NOT DETECTED Final   Bordetella pertussis NOT DETECTED NOT DETECTED Final   Bordetella Parapertussis NOT DETECTED NOT DETECTED Final   Chlamydophila pneumoniae NOT DETECTED NOT DETECTED Final   Mycoplasma pneumoniae NOT DETECTED NOT DETECTED Final    Comment: Performed at [facility], [place] [address], [place], [place] [address]  MRSA Next Gen by PCR, Nasal     Status: None   Collection Time: 02/24/22 11:07 PM   Specimen: Nasal Mucosa; Nasal Swab  Result Value Ref Range Status   MRSA by PCR Next Gen NOT DETECTED NOT DETECTED Final    Comment: (NOTE) The GeneXpert MRSA Assay (FDA approved for NASAL specimens only), is one component of a comprehensive MRSA colonization surveillance program. It is not intended to diagnose MRSA infection nor to guide or monitor treatment for MRSA infections. Test performance is not FDA approved in patients less than 4 years old. Performed  at [facility], [place] [address], [place], [place] [address]   C Difficile Quick Screen w PCR reflex     Status: None   Collection Time: 02/25/22  4:39 PM   Specimen: STOOL  Result Value Ref Range Status   C Diff antigen NEGATIVE NEGATIVE Final   C Diff toxin NEGATIVE NEGATIVE Final   C Diff interpretation No C. difficile detected.  Final    Comment: Performed at [facility], [place] [address] [place], [place] [address]     Discharge Instructions:   Discharge Instructions     Diet general   Complete by: As directed    Discharge instructions   Complete by: As directed    Continue flutter valve Have held your cozaar for now as BP normal off-- check BP at home and resume if SBP >130 consistently Resume statin in 1 week   Increase activity slowly   Complete by: As directed       Allergies as of 02/28/2022       Reactions   Percocet [oxycodone-acetaminophen]         Medication List     STOP taking these medications    losartan 100 MG tablet Commonly known as: COZAAR       TAKE these medications    Acidophilus Caps capsule Take 1 capsule by mouth daily.   allopurinol 300 MG tablet Commonly known as: ZYLOPRIM Take 300 mg by mouth daily.   ammonium lactate 12 % lotion Commonly known as: LAC-HYDRIN Apply 1 application. topically daily as needed for dry skin.   aspirin EC 81 MG tablet Take 81 mg by mouth daily.   atorvastatin 80 MG tablet Commonly known as: LIPITOR Take 1 tablet (80 mg total) by mouth every evening. Start taking on: March 07, 2022 What changed: These instructions start on March 07, 2022. If you are unsure what to do until then, ask your doctor or other care provider.   cyclobenzaprine 5 MG tablet Commonly known as: FLEXERIL Take 5 mg by mouth 3 (three) times daily as needed for muscle spasms.   doxycycline 100 MG tablet Commonly known as: VIBRA-TABS Take 1 tablet (100 mg  total) by mouth every 12 (twelve)  hours.   pantoprazole 40 MG tablet Commonly known as: PROTONIX Take 1 tablet (40 mg total) by mouth 2 (two) times daily.   potassium chloride 10 MEQ tablet Commonly known as: KLOR-CON Take 10 mEq by mouth daily.   sucralfate 1 GM/10ML suspension Commonly known as: CARAFATE TAKE 10ML BY MOUTH 2 TIMES A DAY What changed: See the new instructions.   torsemide 10 MG tablet Commonly known as: DEMADEX Take 0.5 tablets (5 mg total) by mouth daily. Start taking on: March 07, 2022 What changed: These instructions start on March 07, 2022. If you are unsure what to do until then, ask your doctor or other care provider.        Follow-up Information     Madison Hickman, FNP Follow up in 1 week(s).   Specialty: Family Medicine Contact information: [place] [id number] [phone]                  Time coordinating discharge: 33  Signed:  Geradine Girt DO  [facility] 02/28/2022, 8:16 AM

## 2022-02-28 NOTE — Care Management Important Message (Signed)
Important Message  Patient Details IM Letter given to the Patient. Name: Stephen Nash MRN: [id number] Date of Birth: 03-05-1956   Medicare Important Message Given:  Yes     Kerin Salen 02/28/2022, 12:30 PM

## 2022-03-01 LAB — CULTURE, BLOOD (ROUTINE X 2)
Culture: NO GROWTH
Culture: NO GROWTH
Special Requests: ADEQUATE

## 2022-03-21 ENCOUNTER — Ambulatory Visit: Payer: Self-pay | Admitting: General Surgery

## 2022-06-10 NOTE — Pre-Procedure Instructions (Signed)
Surgical Instructions    Your procedure is scheduled on Tuesday September 19.  Report to [facility] Main Entrance "A" at 5:30 A.M., then check in with the Admitting office.  Call this number if you have problems the morning of surgery:  [phone]   If you have any questions prior to your surgery date call [phone]: Open Monday-Friday 8am-4pm    Remember:  Do not eat after midnight the night before your surgery  You may drink clear liquids until 4:30 the morning of your surgery.   Clear liquids allowed are: Water, Non-Citrus Juices (without pulp), Carbonated Beverages, Clear Tea, Black Coffee ONLY (NO MILK, CREAM OR POWDERED CREAMER of any kind), and Gatorade    Take these medicines the morning of surgery with A SIP OF WATER:  allopurinol (ZYLOPRIM)  atorvastatin (LIPITOR) pantoprazole (PROTONIX)   7 days prior to surgery STOP taking any Aspirin (unless otherwise instructed by your surgeon), Aleve, Naproxen, Ibuprofen, Motrin, Advil, Goody's, BC's, all herbal medications, fish oil, and all vitamins.   As of today, STOP taking any Aleve, Naproxen, Ibuprofen, Motrin, Advil, Goody's, BC's, all herbal medications, fish oil, and all vitamins.           Do not wear jewelry or makeup. Do not wear lotions, powders, cologne or deodorant. Do not shave 48 hours prior to surgery.  Men may shave face and neck. Do not bring valuables to the hospital. [facility] is not responsible for any belongings or valuables.    Do NOT Smoke (Tobacco/Vaping)  24 hours prior to your procedure  If you use a CPAP at night, you may bring your mask for your overnight stay.   Contacts, glasses, hearing aids, dentures or partials may not be worn into surgery, please bring cases for these belongings   For patients admitted to the hospital, discharge time will be determined by your treatment team.   Patients discharged the day of surgery will not be allowed to drive home, and someone needs to stay with  them for 24 hours.   SURGICAL WAITING ROOM VISITATION Patients having surgery or a procedure may have no more than 2 support people in the waiting area - these visitors may rotate.   Children under the age of 24 must have an adult with them who is not the patient. If the patient needs to stay at the hospital during part of their recovery, the visitor guidelines for inpatient rooms apply. Pre-op nurse will coordinate an appropriate time for 1 support person to accompany patient in pre-op.  This support person may not rotate.   Please refer to the [facility] website for the visitor guidelines for Inpatients (after your surgery is over and you are in a regular room).    Special instructions:    Oral Hygiene is also important to reduce your risk of infection.  Remember - BRUSH YOUR TEETH THE MORNING OF SURGERY WITH YOUR REGULAR TOOTHPASTE   [place]- Preparing For Surgery  Before surgery, you can play an important role. Because skin is not sterile, your skin needs to be as free of germs as possible. You can reduce the number of germs on your skin by washing with CHG (chlorahexidine gluconate) Soap before surgery.  CHG is an antiseptic cleaner which kills germs and bonds with the skin to continue killing germs even after washing.     Please do not use if you have an allergy to CHG or antibacterial soaps. If your skin becomes reddened/irritated stop using the CHG.  Do  not shave (including legs and underarms) for at least 48 hours prior to first CHG shower. It is OK to shave your face.  Please follow these instructions carefully.     Shower the NIGHT BEFORE SURGERY and the MORNING OF SURGERY with CHG Soap.   If you chose to wash your hair, wash your hair first as usual with your normal shampoo. After you shampoo, rinse your hair and body thoroughly to remove the shampoo.  Then [employer] and genitals (private parts) with your normal soap and rinse thoroughly to remove soap.  After that Use  CHG Soap as you would any other liquid soap. You can apply CHG directly to the skin and wash gently with a scrungie or a clean washcloth.   Apply the CHG Soap to your body ONLY FROM THE NECK DOWN.  Do not use on open wounds or open sores. Avoid contact with your eyes, ears, mouth and genitals (private parts). Wash Face and genitals (private parts)  with your normal soap.   Wash thoroughly, paying special attention to the area where your surgery will be performed.  Thoroughly rinse your body with warm water from the neck down.  DO NOT shower/wash with your normal soap after using and rinsing off the CHG Soap.  Pat yourself dry with a CLEAN TOWEL.  Wear CLEAN PAJAMAS to bed the night before surgery  Place CLEAN SHEETS on your bed the night before your surgery  DO NOT SLEEP WITH PETS.   Day of Surgery:  Take a shower with CHG soap. Wear Clean/Comfortable clothing the morning of surgery Do not apply any deodorants/lotions.   Remember to brush your teeth WITH YOUR REGULAR TOOTHPASTE.    If you received a COVID test during your pre-op visit, it is requested that you wear a mask when out in public, stay away from anyone that may not be feeling well, and notify your surgeon if you develop symptoms. If you have been in contact with anyone that has tested positive in the last 10 days, please notify your surgeon.    Please read over the following fact sheets that you were given.

## 2022-06-11 ENCOUNTER — Encounter (HOSPITAL_COMMUNITY): Payer: Self-pay

## 2022-06-11 ENCOUNTER — Encounter (HOSPITAL_COMMUNITY)
Admission: RE | Admit: 2022-06-11 | Discharge: 2022-06-11 | Disposition: A | Discharge status: Home / Self Care | Payer: Medicare Other | Source: Ambulatory Visit | Attending: General Surgery | Admitting: General Surgery

## 2022-06-11 ENCOUNTER — Other Ambulatory Visit: Payer: Self-pay

## 2022-06-11 VITALS — BP 115/73 | HR 65 | Temp 97.8°F | Resp 17 | Ht 69.0 in | Wt 235.8 lb

## 2022-06-11 DIAGNOSIS — E876 Hypokalemia: Secondary | ICD-10-CM | POA: Diagnosis not present

## 2022-06-11 DIAGNOSIS — Z01812 Encounter for preprocedural laboratory examination: Secondary | ICD-10-CM | POA: Diagnosis present

## 2022-06-11 DIAGNOSIS — I2510 Atherosclerotic heart disease of native coronary artery without angina pectoris: Secondary | ICD-10-CM | POA: Insufficient documentation

## 2022-06-11 DIAGNOSIS — Z9861 Coronary angioplasty status: Secondary | ICD-10-CM | POA: Insufficient documentation

## 2022-06-11 DIAGNOSIS — Z01818 Encounter for other preprocedural examination: Secondary | ICD-10-CM

## 2022-06-11 HISTORY — DX: Nausea with vomiting, unspecified: Z98.890

## 2022-06-11 HISTORY — DX: Dyspnea, unspecified: R06.00

## 2022-06-11 HISTORY — DX: Personal history of other diseases of the digestive system: Z87.19

## 2022-06-11 HISTORY — DX: Pneumonia, unspecified organism: J18.9

## 2022-06-11 HISTORY — DX: Personal history of urinary calculi: Z87.442

## 2022-06-11 LAB — CBC
HCT: 42.7 % (ref 39.0–52.0)
Hemoglobin: 14.7 g/dL (ref 13.0–17.0)
MCH: 30.9 pg (ref 26.0–34.0)
MCHC: 34.4 g/dL (ref 30.0–36.0)
MCV: 89.7 fL (ref 80.0–100.0)
Platelets: 186 10*3/uL (ref 150–400)
RBC: 4.76 MIL/uL (ref 4.22–5.81)
RDW: 14.7 % (ref 11.5–15.5)
WBC: 7.1 10*3/uL (ref 4.0–10.5)
nRBC: 0 % (ref 0.0–0.2)

## 2022-06-11 LAB — BASIC METABOLIC PANEL
Anion gap: 8 (ref 5–15)
BUN: 14 mg/dL (ref 8–23)
CO2: 25 mmol/L (ref 22–32)
Calcium: 9.2 mg/dL (ref 8.9–10.3)
Chloride: 106 mmol/L (ref 98–111)
Creatinine, Ser: 1.15 mg/dL (ref 0.61–1.24)
GFR, Estimated: >60 mL/min (ref >60)
Glucose, Bld: 106 mg/dL — ABNORMAL HIGH (ref 70–99)
Potassium: 2.9 mmol/L — ABNORMAL LOW (ref 3.5–5.1)
Sodium: 139 mmol/L (ref 135–145)

## 2022-06-11 NOTE — Progress Notes (Addendum)
PCP - Roseanna Rainbow, fnp Cardiologist - Nicanor Alcon, MD  at [facility]  PPM/ICD - denies   Chest x-ray - n/a EKG - 02/24/22 Stress Test - 02/17/22-CE ECHO - 05/21/22-CE Cardiac Cath - 04/29/21-CE  Sleep Study - +OSA, wears cpap nightly   7 days prior to surgery STOP taking any Aspirin (unless otherwise instructed by your surgeon), Aleve, Naproxen, Ibuprofen, Motrin, Advil, Goody's, BC's, all herbal medications, fish oil, and all vitamins.     ERAS Protcol -yes PRE-SURGERY Ensure or G2- ensure ordered and given  COVID TEST- no   Anesthesia review: yes, cardiac tests in care everywhere- please review. K 2.9 at PAT appt  Patient denies shortness of breath, fever, cough and chest pain at PAT appointment   All instructions explained to the patient, with a verbal understanding of the material. Patient agrees to go over the instructions while at home for a better understanding. Patient also instructed to self quarantine after being tested for COVID-19. The opportunity to ask questions was provided.

## 2022-06-11 NOTE — Progress Notes (Signed)
Notified Dr. Rosendo Gros of K 2.9.

## 2022-06-12 NOTE — Progress Notes (Signed)
Anesthesia Chart Review:  Follows with cardiology for history of CAD s/p stent LAD 2017, HTN, DVT, syncope (10-day Zio patch monitoring was benign).  Patient had repeat cath in 2022 for evaluation of chest pain which showed some moderate disease proximal to previously placed stent but IFR was negative, recommended continue medical therapy.  Per last office visit with cardiologist Dr. Clydie Braun 02/12/2022, patient doing well from cardiac standpoint, shortness of breath noted to be improved, reportedly overall doing "markedly" better than he was a year ago.  No changes made to management.  OSA on CPAP.  History of mild hypokalemia per review of available labs in Epic. Preop labs show potassium 2.9, which is lower than recent baseline. Will recheck DOS.   TTE 05/21/2022 (Care Everywhere): SUMMARY  The left ventricular size is normal.  There is mild concentric left ventricular hypertrophy.  Left ventricular systolic function is mildly reduced.  LV ejection fraction = 45-50%.  There is mild global hypokinesis of the left ventricle.  The right ventricle is mildly dilated. Right ventricular function  cannot be assessed due to poor image quality.  The right atrium is mildly to moderately dilated.  There is mild aortic regurgitation.  The aortic sinus is normal size.  IVC size was normal.  There is no pericardial effusion.  Difficulty to compare with the prior study due to different image  quality.   Cath 04/29/2021 (Care Everywhere): Coronary Angiography Findings:  --LM: Minimal  --LAD: mid 50% immediately prior to prior LAD stent (10% ISR)  --LCx: prox OM1 30%, LPDA 50%  --RCA: Small, mild disease, non-dominant  --LVEDP: 31 mmHg     Non-obstructive coronary artery disease.   Maximize medical therapy.   Discussed with patient (but may still be drowsy).   Discussed with family: Wife   High left ventricle end diastolic pressure.

## 2022-06-16 NOTE — H&P (Signed)
Chief Complaint: Follow-up (Hiatal hernia)       History of Present Illness: Stephen Nash is a 66 y.o. male who is seen today as an office consultation at the request of Dr. Pasty Arch for evaluation of Follow-up (Hiatal hernia) .   Patient is a 66 year old male, who comes in with history of CAD, sleep apnea, and a large hiatal hernia. Patient states that he was recently hospitalized since his last clinic visit secondary to pneumonia.  He states this was during [place] weekend.  He states that since that time he has been fairly fatigued and rundown.  He has had continued symptoms with his hiatal hernia to include reflux and dysphagia.   Patient is scheduled for septoplasty in July.  He has been cleared by his cardiologist and pulmonologist for this surgery.  He sees them in [facility].       Review of Systems: A complete review of systems was obtained from the patient.  I have reviewed this information and discussed as appropriate with the patient.  See HPI as well for other ROS.   Review of Systems  Constitutional:  Negative for fever.  HENT:  Negative for congestion.   Eyes:  Negative for blurred vision.  Respiratory:  Negative for cough, shortness of breath and wheezing.   Cardiovascular:  Negative for chest pain and palpitations.  Gastrointestinal:  Positive for abdominal pain. Negative for heartburn.  Genitourinary:  Negative for dysuria.  Musculoskeletal:  Negative for myalgias.  Skin:  Negative for rash.  Neurological:  Negative for dizziness and headaches.  Psychiatric/Behavioral:  Negative for depression and suicidal ideas.   All other systems reviewed and are negative.      Medical History: Past Medical History Past Medical History: Diagnosis        Date            Anxiety                         DVT (deep venous thrombosis) (CMS-HCC)              GERD (gastroesophageal reflux disease)                   Hyperlipidemia                         Sleep apnea             There is no problem list on file for this patient.     Past Surgical History Past Surgical History: Procedure       Laterality         Date            gallbladder surgery     N/A                   stint placement surgery          N/A              Allergies Allergies Allergen           Reactions            Codeine           Other (See Comments)            Isosorbide       Swelling            Oxycodone-Acetaminophen   Other (See Comments)  Current Outpatient Medications on File Prior to Visit Medication       Sig       Dispense         Refill            allopurinoL (ZYLOPRIM) 300 MG tablet        Take by mouth                                     atorvastatin (LIPITOR) 80 MG tablet  Take 80 mg by mouth at bedtime                                hydrOXYzine (VISTARIL) 50 MG capsule     Take by mouth 3 (three) times daily as needed                               losartan (COZAAR) 100 MG tablet     Take 50 mg by mouth at bedtime                                pantoprazole (PROTONIX) 40 MG DR tablet            Take by mouth                                     potassium chloride (KLOR-CON) 10 MEQ ER tablet                                      sucralfate (CARAFATE) 100 mg/mL suspension                                            TORsemide (DEMADEX) 10 MG tablet         Take 1 tablet by mouth once daily                      No current facility-administered medications on file prior to visit.     Family History Family History Problem           Relation           Age of Onset            Heart valve disease    Mother              Stroke  Father               High blood pressure (Hypertension)   Father               Heart valve disease    Father          Social History   Tobacco Use Smoking Status           Never Smokeless Tobacco   Never     Social History Social History     Socioeconomic History            Marital status:  Married Tobacco Use            Smoking  status:          Never            Smokeless tobacco:    Never Vaping Use            Vaping Use:    Never used Substance and Sexual Activity            Alcohol use:    Never            Drug use:        Never       Objective:     There were no vitals filed for this visit.  There is no height or weight on file to calculate BMI.   Physical Exam Constitutional:      General: He is not in acute distress.    Appearance: Normal appearance.  HENT:     Head: Normocephalic.     Nose: No rhinorrhea.     Mouth/Throat:     Mouth: Mucous membranes are moist.     Pharynx: Oropharynx is clear.  Eyes:     General: No scleral icterus.    Pupils: Pupils are equal, round, and reactive to light.  Cardiovascular:     Rate and Rhythm: Normal rate.     Pulses: Normal pulses.  Pulmonary:     Effort: Pulmonary effort is normal. No respiratory distress.     Breath sounds: No stridor. No wheezing.  Abdominal:     General: Abdomen is flat. There is no distension.     Tenderness: There is no abdominal tenderness. There is no guarding or rebound.  Musculoskeletal:        General: Normal range of motion.     Cervical back: Normal range of motion and neck supple.  Skin:    General: Skin is warm and dry.     Capillary Refill: Capillary refill takes less than 2 seconds.     Coloration: Skin is not jaundiced.  Neurological:     General: No focal deficit present.     Mental Status: He is alert and oriented to person, place, and time. Mental status is at baseline.  Psychiatric:        Mood and Affect: Mood normal.        Thought Content: Thought content normal.        Judgment: Judgment normal.          Assessment and Plan: Diagnoses and all orders for this visit:   Hiatal hernia     Stephen Nash is a 66 y.o. male    Patient is a 66 year old male with a 5 cm hiatal hernia.  Patient recently complications of the hiatal hernia to include pneumonia.  Patient wants to push forward with surgery at  this point.  We will obtain a cardiac and pulmonary clearance letter prior to scheduling surgery.   1.          We will proceed to the OR for a robotic hiatal hernia repair with mesh and fundoplication 2.         All risks and benefits were discussed with the patient, to generally include infection, bleeding, damage to surrounding structures, possible pneumothorax, and recurrence. Alternatives were offered and described.  All questions were answered and the patient voiced understanding of the procedure and wishes to proceed at this point.

## 2022-06-16 NOTE — Anesthesia Preprocedure Evaluation (Addendum)
Anesthesia Evaluation  Patient identified by MRN, date of birth, ID band Patient awake    Reviewed: Allergy & Precautions, NPO status , Patient's Chart, lab work & pertinent test results  History of Anesthesia Complications (+) PONV and history of anesthetic complications  Airway Mallampati: II  TM Distance: >3 FB Neck ROM: Full    Dental  (+) Teeth Intact, Dental Advisory Given   Pulmonary sleep apnea and Continuous Positive Airway Pressure Ventilation ,    Pulmonary exam normal breath sounds clear to auscultation       Cardiovascular hypertension, Pt. on medications (-) angina+ CAD and + Cardiac Stents  Normal cardiovascular exam Rhythm:Regular Rate:Normal     Neuro/Psych negative neurological ROS  negative psych ROS   GI/Hepatic Neg liver ROS, hiatal hernia, GERD  Medicated,  Endo/Other  Obesity   Renal/GU negative Renal ROS     Musculoskeletal negative musculoskeletal ROS (+)   Abdominal   Peds  Hematology negative hematology ROS (+)   Anesthesia Other Findings   Reproductive/Obstetrics                           Anesthesia Physical Anesthesia Plan  ASA: 2  Anesthesia Plan: General   Post-op Pain Management: Tylenol PO (pre-op)*   Induction: Intravenous  PONV Risk Score and Plan: 4 or greater and Midazolam, Dexamethasone, Ondansetron and Scopolamine patch - Pre-op  Airway Management Planned: Oral ETT  Additional Equipment: Arterial line  Intra-op Plan:   Post-operative Plan: Extubation in OR  Informed Consent: I have reviewed the patients History and Physical, chart, labs and discussed the procedure including the risks, benefits and alternatives for the proposed anesthesia with the patient or authorized representative who has indicated his/her understanding and acceptance.     Dental advisory given  Plan Discussed with: CRNA  Anesthesia Plan Comments: (2nd large  bore PIV  PAT note by Karoline Caldwell, PA-C: Follows with cardiology for history of CAD s/p stent LAD 2017, HTN, DVT, syncope (10-day Zio patch monitoring was benign).  Patient had repeat cath in 2022 for evaluation of chest pain which showed some moderate disease proximal to previously placed stent but IFR was negative, recommended continue medical therapy.  Per last office visit with cardiologist Dr. Clydie Braun 02/12/2022, patient doing well from cardiac standpoint, shortness of breath noted to be improved, reportedly overall doing "markedly" better than he was a year ago.  No changes made to management.   Follows with pulmonology at [facility] for hx of severe OSA on CPAP, chronic DOE. Last seen by Dr. Camillo Flaming 03/10/22. Per note, "-I do not feel further imaging studies, his PFTs are otherwise normal he underwent PFT testing December 6 the results of which showed an FEV1 of 90% FVC of 87%, I do not feel the patient needs bronchodilators from our standpoint, needs to be more compliant with PAP therapy above. RTC yearly.  Patient has clearance from pulmonology and cardiology, copy is on chart.  Cardiology clearance states, "patient is stable and optimized for surgery.  He is at intermediate risk for adverse cardiac event.  He must maintain aspirin perioperatively-this medication cannot be interrupted for the procedure."  History of mild hypokalemia per review of available labs in Epic. Preop labs show potassium 2.9, which is lower than recent baseline. Will recheck DOS.  Remainder of preop labs unremarkable.  EKG 02/24/22 (at time of admission for PNA):  Sinus tachycardia. Atrial premature complex. Low voltage, precordial leads. RVH with secondary repolarization abnrm.  Borderline ST elevation, lateral leads. No significant change since prior 1/21  TTE 05/21/2022 (Care Everywhere): SUMMARY  The left ventricular size is normal.  There is mild concentric left ventricular hypertrophy.  Left ventricular systolic  function is mildly reduced.  LV ejection fraction = 45-50%.  There is mild global hypokinesis of the left ventricle.  The right ventricle is mildly dilated. Right ventricular function  cannot be assessed due to poor image quality.  The right atrium is mildly to moderately dilated.  There is mild aortic regurgitation.  The aortic sinus is normal size.  IVC size was normal.  There is no pericardial effusion.  Difficulty to compare with the prior study due to different image  quality.   Cath 04/29/2021 (Care Everywhere): Coronary Angiography Findings:  --LM: Minimal  --LAD: mid 50% immediately prior to prior LAD stent (10% ISR)  --LCx: prox OM1 30%, LPDA 50%  --RCA: Small, mild disease, non-dominant  --LVEDP: 31 mmHg    Non-obstructive coronary artery disease.   Maximize medical therapy.   Discussed with patient (but may still be drowsy).   Discussed with family: Wife   High left ventricle end diastolic pressure.  )      Anesthesia Quick Evaluation

## 2022-06-17 ENCOUNTER — Other Ambulatory Visit: Payer: Self-pay

## 2022-06-17 ENCOUNTER — Observation Stay (HOSPITAL_COMMUNITY)
Admission: RE | Admit: 2022-06-17 | Discharge: 2022-06-18 | Disposition: A | Discharge status: Home / Self Care | Payer: Medicare Other | Attending: General Surgery | Admitting: General Surgery

## 2022-06-17 ENCOUNTER — Encounter (HOSPITAL_COMMUNITY)
Admission: RE | Disposition: A | Discharge status: Home / Self Care | Payer: Self-pay | Source: Home / Self Care | Attending: General Surgery

## 2022-06-17 ENCOUNTER — Ambulatory Visit (HOSPITAL_BASED_OUTPATIENT_CLINIC_OR_DEPARTMENT_OTHER): Payer: Medicare Other | Admitting: Certified Registered Nurse Anesthetist

## 2022-06-17 ENCOUNTER — Ambulatory Visit (HOSPITAL_COMMUNITY): Payer: Medicare Other | Admitting: Physician Assistant

## 2022-06-17 ENCOUNTER — Encounter (HOSPITAL_COMMUNITY): Payer: Self-pay | Admitting: General Surgery

## 2022-06-17 DIAGNOSIS — Z79899 Other long term (current) drug therapy: Secondary | ICD-10-CM | POA: Diagnosis not present

## 2022-06-17 DIAGNOSIS — K449 Diaphragmatic hernia without obstruction or gangrene: Secondary | ICD-10-CM | POA: Diagnosis present

## 2022-06-17 DIAGNOSIS — Z86718 Personal history of other venous thrombosis and embolism: Secondary | ICD-10-CM | POA: Diagnosis not present

## 2022-06-17 DIAGNOSIS — K219 Gastro-esophageal reflux disease without esophagitis: Secondary | ICD-10-CM | POA: Diagnosis not present

## 2022-06-17 DIAGNOSIS — Z9889 Other specified postprocedural states: Secondary | ICD-10-CM | POA: Diagnosis present

## 2022-06-17 HISTORY — PX: XI ROBOTIC ASSISTED HIATAL HERNIA REPAIR: SHX6889

## 2022-06-17 HISTORY — PX: INSERTION OF MESH: SHX5868

## 2022-06-17 LAB — POCT I-STAT, CHEM 8
BUN: 18 mg/dL (ref 8–23)
Calcium, Ion: 1.17 mmol/L (ref 1.15–1.40)
Chloride: 105 mmol/L (ref 98–111)
Creatinine, Ser: 1.1 mg/dL (ref 0.61–1.24)
Glucose, Bld: 96 mg/dL (ref 70–99)
HCT: 40 % (ref 39.0–52.0)
Hemoglobin: 13.6 g/dL (ref 13.0–17.0)
Potassium: 3.7 mmol/L (ref 3.5–5.1)
Sodium: 141 mmol/L (ref 135–145)
TCO2: 27 mmol/L (ref 22–32)

## 2022-06-17 SURGERY — REPAIR, HERNIA, HIATAL, ROBOT-ASSISTED
Anesthesia: General | Site: Abdomen

## 2022-06-17 MED ORDER — PHENYLEPHRINE 80 MCG/ML (10ML) SYRINGE FOR IV PUSH (FOR BLOOD PRESSURE SUPPORT)
PREFILLED_SYRINGE | INTRAVENOUS | Status: DC | PRN
  Administered 2022-06-17 (×5): 80 ug via INTRAVENOUS

## 2022-06-17 MED ORDER — ROCURONIUM BROMIDE 10 MG/ML (PF) SYRINGE
PREFILLED_SYRINGE | INTRAVENOUS | Status: DC | PRN
  Administered 2022-06-17: 60 mg via INTRAVENOUS
  Administered 2022-06-17: 20 mg via INTRAVENOUS

## 2022-06-17 MED ORDER — ONDANSETRON HCL 4 MG/2ML IJ SOLN
INTRAMUSCULAR | Status: AC
  Filled 2022-06-17: qty 2

## 2022-06-17 MED ORDER — TRAMADOL HCL 50 MG PO TABS
50.0000 mg | ORAL_TABLET | Freq: Four times a day (QID) | ORAL | Status: DC | PRN
  Administered 2022-06-18: 50 mg via ORAL
  Filled 2022-06-17: qty 1

## 2022-06-17 MED ORDER — ONDANSETRON HCL 4 MG/2ML IJ SOLN
INTRAMUSCULAR | Status: DC | PRN
  Administered 2022-06-17: 4 mg via INTRAVENOUS

## 2022-06-17 MED ORDER — ONDANSETRON HCL 4 MG/2ML IJ SOLN
4.0000 mg | Freq: Four times a day (QID) | INTRAMUSCULAR | Status: DC | PRN
  Administered 2022-06-17: 4 mg via INTRAVENOUS
  Filled 2022-06-17: qty 2

## 2022-06-17 MED ORDER — ACETAMINOPHEN 500 MG PO TABS
1000.0000 mg | ORAL_TABLET | ORAL | Status: AC
  Administered 2022-06-17: 1000 mg via ORAL
  Filled 2022-06-17: qty 2

## 2022-06-17 MED ORDER — BUPIVACAINE LIPOSOME 1.3 % IJ SUSP
INTRAMUSCULAR | Status: AC
  Filled 2022-06-17: qty 20

## 2022-06-17 MED ORDER — HYDROMORPHONE HCL 1 MG/ML IJ SOLN
1.0000 mg | INTRAMUSCULAR | Status: DC | PRN
  Administered 2022-06-17 – 2022-06-18 (×4): 1 mg via INTRAVENOUS
  Filled 2022-06-17 (×4): qty 1

## 2022-06-17 MED ORDER — ONDANSETRON 4 MG PO TBDP: 4.0000 mg | ORAL_TABLET | Freq: Four times a day (QID) | ORAL | Status: DC | PRN

## 2022-06-17 MED ORDER — PROMETHAZINE HCL 25 MG/ML IJ SOLN: 6.2500 mg | INTRAMUSCULAR | Status: DC | PRN

## 2022-06-17 MED ORDER — ORAL CARE MOUTH RINSE: 15.0000 mL | Freq: Once | OROMUCOSAL | Status: AC

## 2022-06-17 MED ORDER — TORSEMIDE 10 MG PO TABS
5.0000 mg | ORAL_TABLET | Freq: Every day | ORAL | Status: DC
  Administered 2022-06-17 – 2022-06-18 (×2): 5 mg via ORAL
  Filled 2022-06-17 (×2): qty 0.5

## 2022-06-17 MED ORDER — FENTANYL CITRATE (PF) 100 MCG/2ML IJ SOLN
25.0000 ug | INTRAMUSCULAR | Status: DC | PRN
  Administered 2022-06-17 (×3): 50 ug via INTRAVENOUS

## 2022-06-17 MED ORDER — EPHEDRINE 5 MG/ML INJ
INTRAVENOUS | Status: AC
  Filled 2022-06-17: qty 5

## 2022-06-17 MED ORDER — DEXTROSE-NACL 5-0.9 % IV SOLN: INTRAVENOUS | Status: DC

## 2022-06-17 MED ORDER — FENTANYL CITRATE (PF) 100 MCG/2ML IJ SOLN
INTRAMUSCULAR | Status: AC
  Filled 2022-06-17: qty 2

## 2022-06-17 MED ORDER — LIDOCAINE 2% (20 MG/ML) 5 ML SYRINGE
INTRAMUSCULAR | Status: AC
  Filled 2022-06-17: qty 5

## 2022-06-17 MED ORDER — PHENYLEPHRINE HCL-NACL 20-0.9 MG/250ML-% IV SOLN
INTRAVENOUS | Status: DC | PRN
  Administered 2022-06-17: 20 ug/min via INTRAVENOUS

## 2022-06-17 MED ORDER — ROCURONIUM BROMIDE 10 MG/ML (PF) SYRINGE
PREFILLED_SYRINGE | INTRAVENOUS | Status: AC
  Filled 2022-06-17: qty 10

## 2022-06-17 MED ORDER — ENSURE PRE-SURGERY PO LIQD: 296.0000 mL | Freq: Once | ORAL | Status: DC

## 2022-06-17 MED ORDER — CEFAZOLIN SODIUM-DEXTROSE 2-4 GM/100ML-% IV SOLN
2.0000 g | INTRAVENOUS | Status: AC
  Administered 2022-06-17: 2 g via INTRAVENOUS
  Filled 2022-06-17: qty 100

## 2022-06-17 MED ORDER — MIDAZOLAM HCL 2 MG/2ML IJ SOLN
INTRAMUSCULAR | Status: AC
  Filled 2022-06-17: qty 2

## 2022-06-17 MED ORDER — SODIUM CHLORIDE 0.9 % IV SOLN
INTRAVENOUS | Status: DC | PRN
  Administered 2022-06-17: 40 mL

## 2022-06-17 MED ORDER — BUPIVACAINE HCL (PF) 0.25 % IJ SOLN
INTRAMUSCULAR | Status: AC
  Filled 2022-06-17: qty 30

## 2022-06-17 MED ORDER — LACTATED RINGERS IV SOLN: INTRAVENOUS | Status: DC

## 2022-06-17 MED ORDER — DEXAMETHASONE SODIUM PHOSPHATE 10 MG/ML IJ SOLN
INTRAMUSCULAR | Status: AC
  Filled 2022-06-17: qty 1

## 2022-06-17 MED ORDER — LIDOCAINE 2% (20 MG/ML) 5 ML SYRINGE
INTRAMUSCULAR | Status: DC | PRN
  Administered 2022-06-17: 100 mg via INTRAVENOUS

## 2022-06-17 MED ORDER — CHLORHEXIDINE GLUCONATE CLOTH 2 % EX PADS: 6.0000 | MEDICATED_PAD | Freq: Once | CUTANEOUS | Status: DC

## 2022-06-17 MED ORDER — DEXAMETHASONE SODIUM PHOSPHATE 10 MG/ML IJ SOLN
4.0000 mg | INTRAMUSCULAR | Status: DC
  Filled 2022-06-17: qty 1

## 2022-06-17 MED ORDER — LOSARTAN POTASSIUM 50 MG PO TABS
50.0000 mg | ORAL_TABLET | Freq: Every day | ORAL | Status: DC
  Administered 2022-06-17 – 2022-06-18 (×2): 50 mg via ORAL
  Filled 2022-06-17 (×2): qty 1

## 2022-06-17 MED ORDER — MIDAZOLAM HCL 2 MG/2ML IJ SOLN
INTRAMUSCULAR | Status: DC | PRN
  Administered 2022-06-17: 2 mg via INTRAVENOUS

## 2022-06-17 MED ORDER — SUGAMMADEX SODIUM 200 MG/2ML IV SOLN
INTRAVENOUS | Status: DC | PRN
  Administered 2022-06-17: 200 mg via INTRAVENOUS

## 2022-06-17 MED ORDER — SCOPOLAMINE 1 MG/3DAYS TD PT72
1.0000 | MEDICATED_PATCH | Freq: Once | TRANSDERMAL | Status: DC
  Administered 2022-06-17: 1.5 mg via TRANSDERMAL
  Filled 2022-06-17: qty 1

## 2022-06-17 MED ORDER — EPHEDRINE SULFATE-NACL 50-0.9 MG/10ML-% IV SOSY
PREFILLED_SYRINGE | INTRAVENOUS | Status: DC | PRN
  Administered 2022-06-17: 5 mg via INTRAVENOUS

## 2022-06-17 MED ORDER — 0.9 % SODIUM CHLORIDE (POUR BTL) OPTIME
TOPICAL | Status: DC | PRN
  Administered 2022-06-17: 1000 mL

## 2022-06-17 MED ORDER — FENTANYL CITRATE (PF) 250 MCG/5ML IJ SOLN
INTRAMUSCULAR | Status: DC | PRN
  Administered 2022-06-17 (×3): 50 ug via INTRAVENOUS
  Administered 2022-06-17: 100 ug via INTRAVENOUS

## 2022-06-17 MED ORDER — FENTANYL CITRATE (PF) 250 MCG/5ML IJ SOLN
INTRAMUSCULAR | Status: AC
  Filled 2022-06-17: qty 5

## 2022-06-17 MED ORDER — BUPIVACAINE HCL (PF) 0.25 % IJ SOLN
INTRAMUSCULAR | Status: DC | PRN
  Administered 2022-06-17: 13 mL

## 2022-06-17 MED ORDER — LACTATED RINGERS IV SOLN: INTRAVENOUS | Status: DC | PRN

## 2022-06-17 MED ORDER — PROPOFOL 10 MG/ML IV BOLUS
INTRAVENOUS | Status: DC | PRN
  Administered 2022-06-17: 140 mg via INTRAVENOUS

## 2022-06-17 MED ORDER — PHENYLEPHRINE 80 MCG/ML (10ML) SYRINGE FOR IV PUSH (FOR BLOOD PRESSURE SUPPORT)
PREFILLED_SYRINGE | INTRAVENOUS | Status: AC
  Filled 2022-06-17: qty 10

## 2022-06-17 MED ORDER — CHLORHEXIDINE GLUCONATE 0.12 % MT SOLN
15.0000 mL | Freq: Once | OROMUCOSAL | Status: AC
  Administered 2022-06-17: 15 mL via OROMUCOSAL
  Filled 2022-06-17: qty 15

## 2022-06-17 MED ORDER — ENOXAPARIN SODIUM 40 MG/0.4ML IJ SOSY
40.0000 mg | PREFILLED_SYRINGE | Freq: Every day | INTRAMUSCULAR | Status: DC
  Administered 2022-06-18: 40 mg via SUBCUTANEOUS
  Filled 2022-06-17: qty 0.4

## 2022-06-17 MED ORDER — DEXAMETHASONE SODIUM PHOSPHATE 10 MG/ML IJ SOLN
INTRAMUSCULAR | Status: DC | PRN
  Administered 2022-06-17: 10 mg via INTRAVENOUS

## 2022-06-17 SURGICAL SUPPLY — 68 items
ADH SKN CLS APL DERMABOND .7 (GAUZE/BANDAGES/DRESSINGS) ×2
APL PRP STRL LF DISP 70% ISPRP (MISCELLANEOUS) ×1
APPLIER CLIP 5 13 M/L LIGAMAX5 (MISCELLANEOUS)
APR CLP MED LRG 5 ANG JAW (MISCELLANEOUS)
CANNULA REDUC XI 12-8 STAPL (CANNULA) ×1
CANNULA REDUCER 12-8 DVNC XI (CANNULA) ×1 IMPLANT
CHLORAPREP W/TINT 26 (MISCELLANEOUS) ×1 IMPLANT
CLIP APPLIE 5 13 M/L LIGAMAX5 (MISCELLANEOUS) IMPLANT
COVER MAYO STAND STRL (DRAPES) ×1 IMPLANT
COVER SURGICAL LIGHT HANDLE (MISCELLANEOUS) ×1 IMPLANT
COVER TIP SHEARS 8 DVNC (MISCELLANEOUS) IMPLANT
COVER TIP SHEARS 8MM DA VINCI (MISCELLANEOUS) ×1
DEFOGGER SCOPE WARMER CLEARIFY (MISCELLANEOUS) ×1 IMPLANT
DERMABOND ADVANCED .7 DNX12 (GAUZE/BANDAGES/DRESSINGS) ×1 IMPLANT
DEVICE TROCAR PUNCTURE CLOSURE (ENDOMECHANICALS) ×1 IMPLANT
DRAIN PENROSE 0.5X18 (DRAIN) IMPLANT
DRAPE ARM DVNC X/XI (DISPOSABLE) ×4 IMPLANT
DRAPE CARDIOVASC SPLIT 88X140 (DRAPES) ×1 IMPLANT
DRAPE COLUMN DVNC XI (DISPOSABLE) ×1 IMPLANT
DRAPE DA VINCI XI ARM (DISPOSABLE) ×4
DRAPE DA VINCI XI COLUMN (DISPOSABLE) ×1
DRAPE ORTHO SPLIT 77X108 STRL (DRAPES) ×1
DRAPE SURG ORHT 6 SPLT 77X108 (DRAPES) ×1 IMPLANT
ELECT REM PT RETURN 9FT ADLT (ELECTROSURGICAL) ×1
ELECTRODE REM PT RTRN 9FT ADLT (ELECTROSURGICAL) ×1 IMPLANT
GLOVE BIO SURGEON STRL SZ7.5 (GLOVE) ×2 IMPLANT
GLOVE SURG SYN 7.5  E (GLOVE) ×3
GLOVE SURG SYN 7.5 E (GLOVE) ×3 IMPLANT
GLOVE SURG SYN 7.5 PF PI (GLOVE) ×3 IMPLANT
GOWN STRL REUS W/ TWL LRG LVL3 (GOWN DISPOSABLE) ×1 IMPLANT
GOWN STRL REUS W/ TWL XL LVL3 (GOWN DISPOSABLE) ×2 IMPLANT
GOWN STRL REUS W/TWL 2XL LVL3 (GOWN DISPOSABLE) ×1 IMPLANT
GOWN STRL REUS W/TWL LRG LVL3 (GOWN DISPOSABLE) ×1
GOWN STRL REUS W/TWL XL LVL3 (GOWN DISPOSABLE) ×2
KIT BASIN OR (CUSTOM PROCEDURE TRAY) ×1 IMPLANT
KIT TURNOVER KIT B (KITS) IMPLANT
MARKER SKIN DUAL TIP RULER LAB (MISCELLANEOUS) ×1 IMPLANT
MESH BIO-A 7X10 SYN MAT (Mesh General) ×1 IMPLANT
NDL 22X1.5 STRL (OR ONLY) (MISCELLANEOUS) ×1 IMPLANT
NDL INSUFFLATION 14GA 120MM (NEEDLE) ×1 IMPLANT
NEEDLE 22X1.5 STRL (OR ONLY) (MISCELLANEOUS) ×1 IMPLANT
NEEDLE INSUFFLATION 14GA 120MM (NEEDLE) ×1 IMPLANT
OBTURATOR OPTICAL STANDARD 8MM (TROCAR)
OBTURATOR OPTICAL STND 8 DVNC (TROCAR)
OBTURATOR OPTICALSTD 8 DVNC (TROCAR) IMPLANT
PENCIL SMOKE EVACUATOR (MISCELLANEOUS) IMPLANT
SCISSORS LAP 5X35 DISP (ENDOMECHANICALS) IMPLANT
SEAL CANN UNIV 5-8 DVNC XI (MISCELLANEOUS) ×3 IMPLANT
SEAL XI 5MM-8MM UNIVERSAL (MISCELLANEOUS) ×3
SEALER VESSEL DA VINCI XI (MISCELLANEOUS) ×1
SEALER VESSEL EXT DVNC XI (MISCELLANEOUS) ×1 IMPLANT
SET IRRIG TUBING LAPAROSCOPIC (IRRIGATION / IRRIGATOR) ×1 IMPLANT
SET TUBE SMOKE EVAC HIGH FLOW (TUBING) ×1 IMPLANT
SPIKE FLUID TRANSFER (MISCELLANEOUS) ×1 IMPLANT
STAPLER CANNULA SEAL DVNC XI (STAPLE) ×1 IMPLANT
STAPLER CANNULA SEAL XI (STAPLE) ×1
STAPLER VISISTAT 35W (STAPLE) IMPLANT
STOPCOCK 4 WAY LG BORE MALE ST (IV SETS) ×1 IMPLANT
SUT ETHIBOND 0 36 GRN (SUTURE) ×2 IMPLANT
SUT ETHIBOND 2 0 SH (SUTURE)
SUT ETHIBOND 2 0 SH 36X2 (SUTURE) IMPLANT
SUT MNCRL AB 4-0 PS2 18 (SUTURE) ×1 IMPLANT
SUT SILK 0 SH 30 (SUTURE) ×1 IMPLANT
SUT VICRYL 0 UR6 27IN ABS (SUTURE) IMPLANT
SYR 30ML SLIP (SYRINGE) ×1 IMPLANT
TRAY FOLEY MTR SLVR 16FR STAT (SET/KITS/TRAYS/PACK) ×1 IMPLANT
TRAY LAPAROSCOPIC MC (CUSTOM PROCEDURE TRAY) ×1 IMPLANT
TROCAR ADV FIXATION 5X100MM (TROCAR) ×1 IMPLANT

## 2022-06-17 NOTE — Transfer of Care (Signed)
Immediate Anesthesia Transfer of Care Note  Patient: Stephen Nash  Procedure(s) Performed: XI ROBOTIC ASSISTED HIATAL HERNIA REPAIR WITH MESH AND FUNDOPLICATION (Abdomen) INSERTION OF MESH (Abdomen)  Patient Location: PACU  Anesthesia Type:General  Level of Consciousness: awake, alert  and oriented  Airway & Oxygen Therapy: Patient Spontanous Breathing and Patient connected to face mask oxygen  Post-op Assessment: Report given to RN and Post -op Vital signs reviewed and stable  Post vital signs: Reviewed and stable  Last Vitals:  Vitals Value Taken Time  BP 137/78 06/17/22 0947  Temp    Pulse 70 06/17/22 0949  Resp 22 06/17/22 0949  SpO2 96 % 06/17/22 0949  Vitals shown include unvalidated device data.  Last Pain:  Vitals:   06/17/22 0620  TempSrc:   PainSc: 0-No pain         Complications: No notable events documented.

## 2022-06-17 NOTE — Anesthesia Procedure Notes (Signed)
Procedure Name: Intubation Date/Time: 06/17/2022 7:46 AM  Performed by: Genelle Bal, CRNAPre-anesthesia Checklist: Patient identified, Emergency Drugs available, Suction available and Patient being monitored Patient Re-evaluated:Patient Re-evaluated prior to induction Oxygen Delivery Method: Circle system utilized Preoxygenation: Pre-oxygenation with 100% oxygen Induction Type: IV induction Ventilation: Two handed mask ventilation required Laryngoscope Size: Mac and 4 Grade View: Grade I Tube type: Oral Tube size: 7.5 mm Number of attempts: 1 Airway Equipment and Method: Stylet Placement Confirmation: ETT inserted through vocal cords under direct vision, positive ETCO2 and breath sounds checked- equal and bilateral Secured at: 23 cm Tube secured with: Tape Dental Injury: Teeth and Oropharynx as per pre-operative assessment  Comments: Performed by Encarnacion Chu, SRNA

## 2022-06-17 NOTE — Anesthesia Procedure Notes (Signed)
Arterial Line Insertion Start/End9/19/2023 7:22 AM, 06/17/2022 7:27 AM Performed by: Santa Lighter, MD, anesthesiologist  Patient location: Pre-op. Preanesthetic checklist: patient identified, IV checked, site marked, risks and benefits discussed, surgical consent, monitors and equipment checked, pre-op evaluation, timeout performed and anesthesia consent Lidocaine 1% used for infiltration Left, radial was placed Catheter size: 20 G Hand hygiene performed  and maximum sterile barriers used   Attempts: 1 Procedure performed using ultrasound guided technique. Ultrasound Notes:anatomy identified, needle tip was noted to be adjacent to the nerve/plexus identified, no ultrasound evidence of intravascular and/or intraneural injection and image(s) printed for medical record Following insertion, dressing applied and Biopatch. Post procedure assessment: normal and unchanged  Post procedure complications: second provider assisted. Patient tolerated the procedure well with no immediate complications. Additional procedure comments: Attempt on right UE by CRNA.Marland Kitchen

## 2022-06-17 NOTE — Interval H&P Note (Signed)
History and Physical Interval Note:  06/17/2022 7:13 AM  Stephen Nash  has presented today for surgery, with the diagnosis of HIATAL HERNIA AND GERD.  The various methods of treatment have been discussed with the patient and family. After consideration of risks, benefits and other options for treatment, the patient has consented to  Procedure(s): XI ROBOTIC ASSISTED HIATAL HERNIA REPAIR WITH MESH AND FUNDOPLICATION (N/A) as a surgical intervention.  The patient's history has been reviewed, patient examined, no change in status, stable for surgery.  I have reviewed the patient's chart and labs.  Questions were answered to the patient's satisfaction.     Ralene Ok

## 2022-06-17 NOTE — Op Note (Signed)
06/17/2022  9:28 AM  PATIENT:  Stephen Nash  66 y.o. male  PRE-OPERATIVE DIAGNOSIS:  HIATAL HERNIA AND GERD  POST-OPERATIVE DIAGNOSIS:  HIATAL HERNIA AND GERD  PROCEDURE:  Procedure(s): XI ROBOTIC ASSISTED HIATAL HERNIA REPAIR WITH MESH AND FUNDOPLICATION (N/A) INSERTION OF MESH (N/A)  SURGEON:  Surgeon(s) and Role:    Ralene Ok, MD - Primary  ASSISTANTS: Pryor Curia, RNFA   ANESTHESIA:   local and general  EBL:  minimal   BLOOD ADMINISTERED:none  DRAINS: none   LOCAL MEDICATIONS USED:  BUPIVICAINE And Exparel  SPECIMEN:  No Specimen  DISPOSITION OF SPECIMEN:  N/A  COUNTS:  YES  TOURNIQUET:  * No tourniquets in log *  DICTATION: .Dragon Dictation The patient was taken back to the operating room and placed in the supine position with bilateral SCDs in place. The patient was prepped and draped in the usual sterile fashion. After appropriate antibiotics were confirmed a timeout was called and all facts were verified.   A Veress needle technique was used to insufflate the abdomen to 15 mm of mercury the paramedian stab incision. Subsequent to this an 8 mm trocar was introduced as was a 8 millimeter camera. At this time the subsequent robotic trochars x3, were then placed adjacent to this trocar approximately 8-10 cm away. Each trocar was inserted under direct visualization, there were total of 4 trochars. A 74m trocar was placed in the midclavicular line.  A 0vicryl was placed to help with closure at the end of the case.The assistant trocar was then placed in the right lower quadrant under direct visualization. The Nathanson retractor was then visualized inserted into the abdomen and the incision just to the left of the falciform ligament. This was then placed to retract the liver appropriately. At this time the patient was positioned in reverse Trendelenburg.   At this time the robot patient cart was brought to the bedside and placed in good position and the arms  were docked to the trochars appropriately. At this time I proceeded to incised the gastrohepatic ligament.  At this time I proceeded to mobilize the stomach inferiorly and visualize the right crus. The peritoneum over the right crus was incised and right crus was identified. I proceeded to dissect this inferiorly until the left crus was seen joining the right crus. Once the right crus was adequately dissected we turned our to the left crus which was dissected away. This required traction of the stomach to the right side. Once this was visualized we then proceeded to circumferentially dissect the esophagus away from the surrounding tissue. The anterior and posterior vagus was seen along the esophagus at the GE junction.  These were both preserved throughout the entire case. There was a small-sized hiatal hernia seen. I mobilized the esophagus cephalad approximately 4-5 cm, clearing away the surrounding tissue. The anterior hernia sac was dissected away from the stomach and esophagus.  At this time we turned our attention to the greater curvature the stomach and the omentum was mobilized using the robotic vessel sealer. This was taken up to the greater curvature to the hiatus. This mobilized the entire greater curvature to allow mobilization and the wrap. I then proceeded to bring the greater curvature the stomach posterior to the esophagus, and a shoeshine technique was used to evaluate the mobilization of the greater curvature.   At this time I proceeded to close the hiatus using interrupted 0 Ethibonds x 2. This brought together the hiatal closure without undue stricture to  the esophagus.   A piece of Gore Bio A hiatal mesh was placed over the hiatal closure and sutured to the crus using 0 Ethibonds sutures x 3.  At this time the greater curvature was brought around the esophagus and sutured using 0 silk sutures interrupted fashion approximately 1 cm apart x3 on each side of the esophagus in a Toupet  fashion. A left collar stitch was then used to gastropexy the stomach from the wrap to the diaphragm just lateral to the left crus as.  A second collar stitch was placed from the wrap to the right crus.  The wrap lay loose with no strangulation of the esophagus.  At this time the robot was undocked. The liver trocar was removed. At this time insufflation was evacuated. Skin was reapproximated for Monocryl subcuticular fashion. The skin was then dressed with Dermabond. The patient tolerated the procedure well and was taken to the recovery room in stable condition.   PLAN OF CARE: Admit for overnight observation  PATIENT DISPOSITION:  PACU - hemodynamically stable.   Delay start of Pharmacological VTE agent (>24hrs) due to surgical blood loss or risk of bleeding: yes

## 2022-06-17 NOTE — Anesthesia Postprocedure Evaluation (Signed)
Anesthesia Post Note  Patient: Stephen Nash  Procedure(s) Performed: XI ROBOTIC ASSISTED HIATAL HERNIA REPAIR WITH MESH AND FUNDOPLICATION (Abdomen) INSERTION OF MESH (Abdomen)     Patient location during evaluation: PACU Anesthesia Type: General Level of consciousness: awake and alert Pain management: pain level controlled Vital Signs Assessment: post-procedure vital signs reviewed and stable Respiratory status: spontaneous breathing, nonlabored ventilation, respiratory function stable and patient connected to nasal cannula oxygen Cardiovascular status: blood pressure returned to baseline and stable Postop Assessment: no apparent nausea or vomiting Anesthetic complications: no   No notable events documented.  Last Vitals:  Vitals:   06/17/22 1253 06/17/22 1920  BP: 128/70 (!) 140/87  Pulse: 72 86  Resp: 16 16  Temp: 37.1 C 36.6 C  SpO2: 96% 95%    Last Pain:  Vitals:   06/17/22 1945  TempSrc:   PainSc: 10-Worst pain ever                 Santa Lighter

## 2022-06-17 NOTE — Discharge Instructions (Signed)
EATING AFTER YOUR ESOPHAGEAL SURGERY (Stomach Fundoplication, Hiatal Hernia repair, Achalasia surgery, etc)  ######################################################################  EAT Start with a pureed / full liquid diet (see below) Gradually transition to a high fiber diet with a fiber supplement over the next month after discharge.    WALK Walk an hour a day.  Control your pain to do that.    CONTROL PAIN Control pain so that you can walk, sleep, tolerate sneezing/coughing, go up/down stairs.  HAVE A BOWEL MOVEMENT DAILY Keep your bowels regular to avoid problems.  OK to try a laxative to override constipation.  OK to use an antidairrheal to slow down diarrhea.  Call if not better after 2 tries  CALL IF YOU HAVE PROBLEMS/CONCERNS Call if you are still struggling despite following these instructions. Call if you have concerns not answered by these instructions  ######################################################################   After your esophageal surgery, expect some sticking with swallowing over the next 1-2 months.    If food sticks when you eat, it is called "dysphagia".  This is due to swelling around your esophagus at the wrap & hiatal diaphragm repair.  It will gradually ease off over the next few months.  To help you through this temporary phase, we start you out on a pureed (blenderized) diet.  Your first meal in the hospital was thin liquids.  You should have been given a pureed diet by the time you left the hospital.  We ask patients to stay on a pureed diet for the first 2-3 weeks to avoid anything getting "stuck" near your recent surgery.  Don't be alarmed if your ability to swallow doesn't progress according to this plan.  Everyone is different and some diets can advance more or less quickly.    It is often helpful to crush your medications or split them as they can sometimes stick, especially the first week or so.   Some BASIC RULES to follow  are:  Maintain an upright position whenever eating or drinking.  Take small bites - just a teaspoon size bite at a time.  Eat slowly.  It may also help to eat only one food at a time.  Consider nibbling through smaller, more frequent meals & avoid the urge to eat BIG meals  Do not push through feelings of fullness, nausea, or bloatedness  Do not mix solid foods and liquids in the same mouthful  Try not to "wash foods down" with large gulps of liquids.  Avoid carbonated (bubbly/fizzy) drinks.    Avoid foods that make you feel gassy or bloated.  Start with bland foods first.  Wait on trying greasy, fried, or spicy meals until you are tolerating more bland solids well.  Understand that it will be hard to burp and belch at first.  This gradually improves with time.  Expect to be more gassy/flatulent/bloated initially.  Walking will help your body manage it better.  Consider using medications for bloating that contain simethicone such as  Maalox or Gas-X   Consider crushing her medications, especially smaller pills.  The ability to swallow pills should get easier after a few weeks  Eat in a relaxed atmosphere & minimize distractions.  Avoid talking while eating.    Do not use straws.  Following each meal, sit in an upright position (90 degree angle) for 60 to 90 minutes.  Going for a short walk can help as well  If food does stick, don't panic.  Try to relax and let the food pass on its own.    Sipping WARM LIQUID such as strong hot black tea can also help slide it down.   Be gradual in changes & use common sense:  -If you easily tolerating a certain "level" of foods, advance to the next level gradually -If you are having trouble swallowing a particular food, then avoid it.   -If food is sticking when you advance your diet, go back to thinner previous diet (the lower LEVEL) for 1-2 days.  LEVEL 1 = PUREED DIET  Do for the first 2 WEEKS AFTER SURGERY  -Foods in this group are  pureed or blenderized to a smooth, mashed potato-like consistency.  -If necessary, the pureed foods can keep their shape with the addition of a thickening agent.   -Meat should be pureed to a smooth, pasty consistency.  Hot broth or gravy may be added to the pureed meat, approximately 1 oz. of liquid per 3 oz. serving of meat. -CAUTION:  If any foods do not puree into a smooth consistency, swallowing will be more difficult.  (For example, nuts or seeds sometimes do not blend well.)  Hot Foods Cold Foods  Pureed scrambled eggs and cheese Pureed cottage cheese  Baby cereals Thickened juices and nectars  Thinned cooked cereals (no lumps) Thickened milk or eggnog  Pureed French toast or pancakes Ensure  Mashed potatoes Ice cream  Pureed parsley, au gratin, scalloped potatoes, candied sweet potatoes Fruit or Italian ice, sherbet  Pureed buttered or alfredo noodles Plain yogurt  Pureed vegetables (no corn or peas) Instant breakfast  Pureed soups and creamed soups Smooth pudding, mousse, custard  Pureed scalloped apples Whipped gelatin  Gravies Sugar, syrup, honey, jelly  Sauces, cheese, tomato, barbecue, white, creamed Cream  Any baby food Creamer  Alcohol in moderation (not beer or champagne) Margarine  Coffee or tea Mayonnaise   Ketchup, mustard   Apple sauce   SAMPLE MENU:  PUREED DIET Breakfast Lunch Dinner   Orange juice, 1/2 cup  Cream of wheat, 1/2 cup  Pineapple juice, 1/2 cup  Pureed turkey, barley soup, 3/4 cup  Pureed Hawaiian chicken, 3 oz   Scrambled eggs, mashed or blended with cheese, 1/2 cup  Tea or coffee, 1 cup   Whole milk, 1 cup   Non-dairy creamer, 2 Tbsp.  Mashed potatoes, 1/2 cup  Pureed cooled broccoli, 1/2 cup  Apple sauce, 1/2 cup  Coffee or tea  Mashed potatoes, 1/2 cup  Pureed spinach, 1/2 cup  Frozen yogurt, 1/2 cup  Tea or coffee      LEVEL 2 = SOFT DIET  After your first 2 weeks, you can advance to a soft diet.   Keep on this  diet until everything goes down easily.  Hot Foods Cold Foods  White fish Cottage cheese  Stuffed fish Junior baby fruit  Baby food meals Semi thickened juices  Minced soft cooked, scrambled, poached eggs nectars  Souffle & omelets Ripe mashed bananas  Cooked cereals Canned fruit, pineapple sauce, milk  potatoes Milkshake  Buttered or Alfredo noodles Custard  Cooked cooled vegetable Puddings, including tapioca  Sherbet Yogurt  Vegetable soup or alphabet soup Fruit ice, Italian ice  Gravies Whipped gelatin  Sugar, syrup, honey, jelly Junior baby desserts  Sauces:  Cheese, creamed, barbecue, tomato, white Cream  Coffee or tea Margarine   SAMPLE MENU:  LEVEL 2 Breakfast Lunch Dinner   Orange juice, 1/2 cup  Oatmeal, 1/2 cup  Scrambled eggs with cheese, 1/2 cup  Decaffeinated tea, 1 cup  Whole milk, 1 cup    Non-dairy creamer, 2 Tbsp  Pineapple juice, 1/2 cup  Minced beef, 3 oz  Gravy, 2 Tbsp  Mashed potatoes, 1/2 cup  Minced fresh broccoli, 1/2 cup  Applesauce, 1/2 cup  Coffee, 1 cup  Turkey, barley soup, 3/4 cup  Minced Hawaiian chicken, 3 oz  Mashed potatoes, 1/2 cup  Cooked spinach, 1/2 cup  Frozen yogurt, 1/2 cup  Non-dairy creamer, 2 Tbsp      LEVEL 3 = CHOPPED DIET  -After all the foods in level 2 (soft diet) are passing through well you should advance up to more chopped foods.  -It is still important to cut these foods into small pieces and eat slowly.  Hot Foods Cold Foods  Poultry Cottage cheese  Chopped Swedish meatballs Yogurt  Meat salads (ground or flaked meat) Milk  Flaked fish (tuna) Milkshakes  Poached or scrambled eggs Soft, cold, dry cereal  Souffles and omelets Fruit juices or nectars  Cooked cereals Chopped canned fruit  Chopped French toast or pancakes Canned fruit cocktail  Noodles or pasta (no rice) Pudding, mousse, custard  Cooked vegetables (no frozen peas, corn, or mixed vegetables) Green salad  Canned small sweet peas  Ice cream  Creamed soup or vegetable soup Fruit ice, Italian ice  Pureed vegetable soup or alphabet soup Non-dairy creamer  Ground scalloped apples Margarine  Gravies Mayonnaise  Sauces:  Cheese, creamed, barbecue, tomato, white Ketchup  Coffee or tea Mustard   SAMPLE MENU:  LEVEL 3 Breakfast Lunch Dinner   Orange juice, 1/2 cup  Oatmeal, 1/2 cup  Scrambled eggs with cheese, 1/2 cup  Decaffeinated tea, 1 cup  Whole milk, 1 cup  Non-dairy creamer, 2 Tbsp  Ketchup, 1 Tbsp  Margarine, 1 tsp  Salt, 1/4 tsp  Sugar, 2 tsp  Pineapple juice, 1/2 cup  Ground beef, 3 oz  Gravy, 2 Tbsp  Mashed potatoes, 1/2 cup  Cooked spinach, 1/2 cup  Applesauce, 1/2 cup  Decaffeinated coffee  Whole milk  Non-dairy creamer, 2 Tbsp  Margarine, 1 tsp  Salt, 1/4 tsp  Pureed turkey, barley soup, 3/4 cup  Barbecue chicken, 3 oz  Mashed potatoes, 1/2 cup  Ground fresh broccoli, 1/2 cup  Frozen yogurt, 1/2 cup  Decaffeinated tea, 1 cup  Non-dairy creamer, 2 Tbsp  Margarine, 1 tsp  Salt, 1/4 tsp  Sugar, 1 tsp    LEVEL 4:  REGULAR FOODS  -Foods in this group are soft, moist, regularly textured foods.   -This level includes meat and breads, which tend to be the hardest things to swallow.   -Eat very slowly, chew well and continue to avoid carbonated drinks. -most people are at this level in 4-6 weeks  Hot Foods Cold Foods  Baked fish or skinned Soft cheeses - cottage cheese  Souffles and omelets Cream cheese  Eggs Yogurt  Stuffed shells Milk  Spaghetti with meat sauce Milkshakes  Cooked cereal Cold dry cereals (no nuts, dried fruit, coconut)  French toast or pancakes Crackers  Buttered toast Fruit juices or nectars  Noodles or pasta (no rice) Canned fruit  Potatoes (all types) Ripe bananas  Soft, cooked vegetables (no corn, lima, or baked beans) Peeled, ripe, fresh fruit  Creamed soups or vegetable soup Cakes (no nuts, dried fruit, coconut)  Canned chicken  noodle soup Plain doughnuts  Gravies Ice cream  Bacon dressing Pudding, mousse, custard  Sauces:  Cheese, creamed, barbecue, tomato, white Fruit ice, Italian ice, sherbet  Decaffeinated tea or coffee Whipped gelatin  Pork chops Regular gelatin     Canned fruited gelatin molds   Sugar, syrup, honey, jam, jelly   Cream   Non-dairy   Margarine   Oil   Mayonnaise   Ketchup   Mustard   TROUBLESHOOTING IRREGULAR BOWELS  1) Avoid extremes of bowel movements (no bad constipation/diarrhea)  2) Miralax 17gm mixed in 8oz. water or juice-daily. May use BID as needed.  3) Gas-x,Phazyme, etc. as needed for gas & bloating.  4) Soft,bland diet. No spicy,greasy,fried foods.  5) Prilosec over-the-counter as needed  6) May hold gluten/wheat products from diet to see if symptoms improve.  7) May try probiotics (Align, Activa, etc) to help calm the bowels down  7) If symptoms become worse call back immediately.    If you have any questions please call our office at [facility]: [phone].  

## 2022-06-18 ENCOUNTER — Observation Stay (HOSPITAL_COMMUNITY): Payer: Medicare Other

## 2022-06-18 ENCOUNTER — Encounter (HOSPITAL_COMMUNITY): Payer: Self-pay | Admitting: General Surgery

## 2022-06-18 DIAGNOSIS — K449 Diaphragmatic hernia without obstruction or gangrene: Secondary | ICD-10-CM | POA: Diagnosis not present

## 2022-06-18 MED ORDER — HYDROCODONE-ACETAMINOPHEN 7.5-325 MG/15ML PO SOLN: 15.0000 mL | Freq: Four times a day (QID) | ORAL | 0 refills | Status: AC | PRN

## 2022-06-18 MED ORDER — HYDROCODONE-ACETAMINOPHEN 7.5-325 MG/15ML PO SOLN: 10.0000 mL | ORAL | Status: DC | PRN

## 2022-06-18 MED ORDER — IOHEXOL 300 MG/ML  SOLN
100.0000 mL | Freq: Once | INTRAMUSCULAR | Status: AC | PRN
  Administered 2022-06-18: 100 mL via ORAL

## 2022-06-18 NOTE — Progress Notes (Signed)
Mobility Specialist - Progress Note   06/18/22 0922  Mobility  Activity Ambulated with assistance in hallway  Level of Assistance Standby assist, set-up cues, supervision of patient - no hands on  Assistive Device Front wheel walker  Distance Ambulated (ft) 380 ft  Activity Response Tolerated well  $Mobility charge 1 Mobility    Pt received in bed agreeable to mobility. C/o SOB during ambulation. Left EOB w/ call bell in reach and all needs met.   Sydney Joyce Mobility Specialist  

## 2022-06-18 NOTE — Progress Notes (Signed)
Nsg Discharge Note  Admit Date:  06/17/2022 Discharge date: 06/18/2022   [facility] to be D/C'd Home per MD order.  AVS completed. Patient/caregiver able to verbalize understanding.  Discharge Medication: Allergies as of 06/18/2022       Reactions   Percocet [oxycodone-acetaminophen]    Hallucinations         Medication List     TAKE these medications    Acidophilus Caps capsule Take 1 capsule by mouth daily.   allopurinol 300 MG tablet Commonly known as: ZYLOPRIM Take 300 mg by mouth daily.   ammonium lactate 12 % lotion Commonly known as: LAC-HYDRIN Apply 1 application. topically daily as needed for dry skin.   aspirin EC 81 MG tablet Take 81 mg by mouth daily.   atorvastatin 80 MG tablet Commonly known as: LIPITOR Take 1 tablet (80 mg total) by mouth every evening.   doxycycline 100 MG tablet Commonly known as: VIBRA-TABS Take 1 tablet (100 mg total) by mouth every 12 (twelve) hours.   HYDROcodone-acetaminophen 7.5-325 mg/15 ml solution Commonly known as: HYCET Take 15 mLs by mouth 4 (four) times daily as needed for moderate pain.   hydrOXYzine 50 MG tablet Commonly known as: ATARAX Take 50 mg by mouth at bedtime as needed for sleep.   losartan 100 MG tablet Commonly known as: COZAAR Take 50 mg by mouth daily.   pantoprazole 40 MG tablet Commonly known as: PROTONIX Take 1 tablet (40 mg total) by mouth 2 (two) times daily.   sucralfate 1 GM/10ML suspension Commonly known as: CARAFATE TAKE 10ML BY MOUTH 2 TIMES A DAY What changed: See the new instructions.   torsemide 10 MG tablet Commonly known as: DEMADEX Take 0.5 tablets (5 mg total) by mouth daily.        Discharge Assessment: Vitals:   06/18/22 0441 06/18/22 0844  BP: 107/68 118/66  Pulse: 81 75  Resp: 16 18  Temp: 97.8 F (36.6 C) 98.5 F (36.9 C)  SpO2: 92% 92%   Skin clean, dry and intact without evidence of skin break down, no evidence of skin tears noted. IV catheter  discontinued intact. Site without signs and symptoms of complications - no redness or edema noted at insertion site, patient denies c/o pain - only slight tenderness at site.  Dressing with slight pressure applied.  D/c Instructions-Education: Discharge instructions given to patient/family with verbalized understanding. D/c education completed with patient/family including follow up instructions, medication list, d/c activities limitations if indicated, with other d/c instructions as indicated by MD - patient able to verbalize understanding, all questions fully answered. Patient instructed to return to ED, call 911, or call MD for any changes in condition.  Patient escorted via [place], and D/C home via private auto.  Atilano Ina, RN 06/18/2022 2:34 PM

## 2022-06-18 NOTE — Care Management Obs Status (Signed)
[facility] NOTIFICATION   Patient Details  Name: Thoms Barthelemy MRN: [id number] Date of Birth: 1955/10/05   Medicare Observation Status Notification Given:  Yes    Carles Collet, RN 06/18/2022, 9:54 AM

## 2022-06-18 NOTE — Progress Notes (Signed)
Nutrition Education Note   Provided pt and wife with handout for post-op Nissen diet. Pt to be on blended/pureed diet x 2 weeks and MD to advance diet. Discussed ways to increase calories and protein, foods/beverages to avoid, and no straws. Encouraged the use protein shakes to help with obtaining calorie and protein needs.Teach back method was used. This RD answered any questions pt had.   Encouraged pt to have RN contact RD with any additional questions.   Hermina Barters RD, LDN Clinical Dietitian See Shea Evans for contact information.

## 2022-06-19 NOTE — Discharge Summary (Signed)
Physician Discharge Summary  Patient ID: Stephen Nash MRN: [id number] DOB/AGE: 67-Oct-1957 67 y.o.  Admit date: 06/17/2022 Discharge date: 06/19/2022  Admission Diagnoses: Hiatal hernia  Discharge Diagnoses:  Principal Problem:   S/P Nissen fundoplication (without gastrostomy tube) procedure   Discharged Condition: good  Hospital Course: Patient was admitted postop please see operative note for details.  Patient did well postoperatively.  Patient had good pain control.  Tolerating p.o. well.  Patient underwent esophagram which revealed no leak.  The patient was stable for discharge and discharged home.  Consults: Laterally.None  Significant Diagnostic Studies: Esophagram without any leaks  Treatments: surgery: As above  Discharge Exam: Blood pressure 118/66, pulse 75, temperature 98.5 F (36.9 C), temperature source Oral, resp. rate 18, height '5\' 9"'$  (1.753 m), weight 106.6 kg, SpO2 92 %. General appearance: alert and cooperative GI: soft, non-tender; bowel sounds normal; no masses,  no organomegaly and inc c/d/i  Disposition: Discharge disposition: 01-Home or Self Care       Discharge Instructions     Diet - low sodium heart healthy   Complete by: As directed    Increase activity slowly   Complete by: As directed       Allergies as of 06/18/2022       Reactions   Percocet [oxycodone-acetaminophen]    Hallucinations         Medication List     TAKE these medications    Acidophilus Caps capsule Take 1 capsule by mouth daily.   allopurinol 300 MG tablet Commonly known as: ZYLOPRIM Take 300 mg by mouth daily.   ammonium lactate 12 % lotion Commonly known as: LAC-HYDRIN Apply 1 application. topically daily as needed for dry skin.   aspirin EC 81 MG tablet Take 81 mg by mouth daily.   atorvastatin 80 MG tablet Commonly known as: LIPITOR Take 1 tablet (80 mg total) by mouth every evening.   doxycycline 100 MG tablet Commonly known as:  VIBRA-TABS Take 1 tablet (100 mg total) by mouth every 12 (twelve) hours.   HYDROcodone-acetaminophen 7.5-325 mg/15 ml solution Commonly known as: HYCET Take 15 mLs by mouth 4 (four) times daily as needed for moderate pain.   hydrOXYzine 50 MG tablet Commonly known as: ATARAX Take 50 mg by mouth at bedtime as needed for sleep.   losartan 100 MG tablet Commonly known as: COZAAR Take 50 mg by mouth daily.   pantoprazole 40 MG tablet Commonly known as: PROTONIX Take 1 tablet (40 mg total) by mouth 2 (two) times daily.   sucralfate 1 GM/10ML suspension Commonly known as: CARAFATE TAKE 10ML BY MOUTH 2 TIMES A DAY What changed: See the new instructions.   torsemide 10 MG tablet Commonly known as: DEMADEX Take 0.5 tablets (5 mg total) by mouth daily.        Follow-up Information     Ralene Ok, MD. Schedule an appointment as soon as possible for a visit in 2 week(s).   Specialty: General Surgery Why: Post op visit Contact information: [address] [place] [address] [phone]                 Signed: Ralene Ok 06/19/2022, 1:15 PM

## 2022-12-03 IMAGING — CT CT RENAL STONE PROTOCOL
2 of 4 series · 16 of 46 positions shown, 18 images · non-contrast
Comparison: 05/05/2021

CLINICAL DATA: Right flank pain, nausea and vomiting for 3 weeks



[Series 2: axial st · axial · 0.96mm/px · z∈[-660,-215]mm · 13 of 101 slices shown, 15 images]
[im 6/101  soft-tissue]
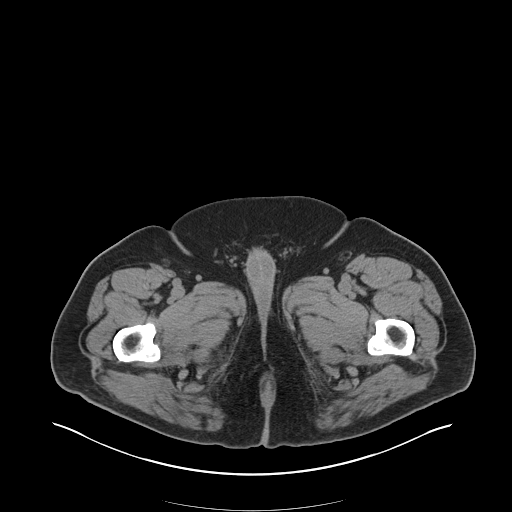
[im 6/101  bone]
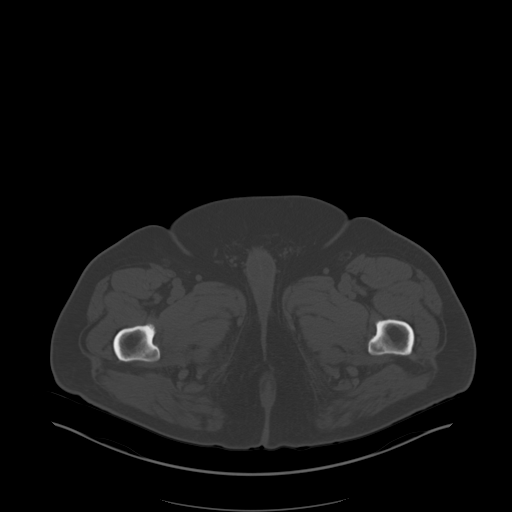
[im 12/101  soft-tissue]
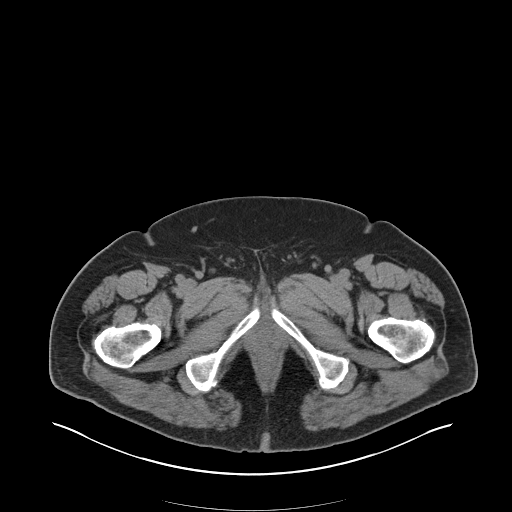
[im 23/101  soft-tissue]
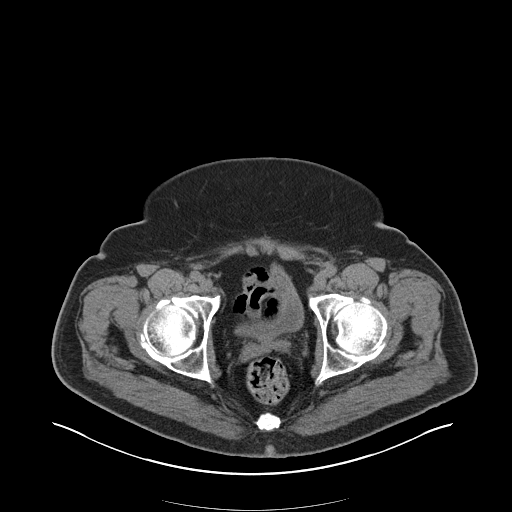
[im 28/101  soft-tissue]
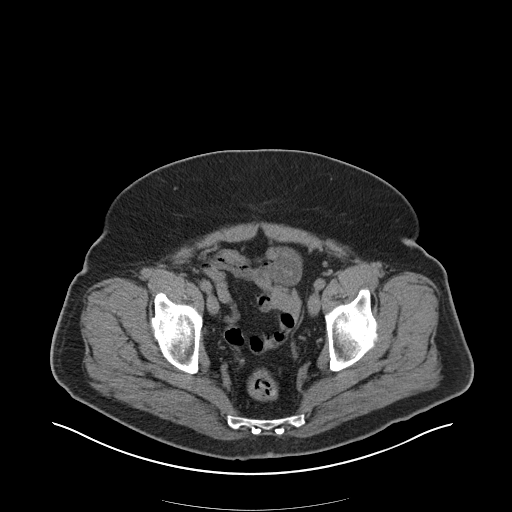
[im 34/101  soft-tissue]
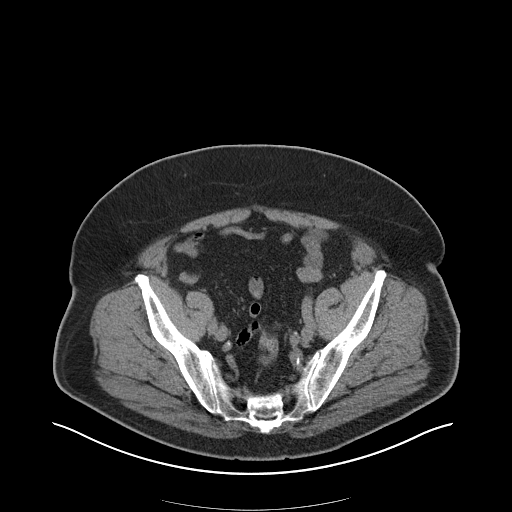
[im 45/101  soft-tissue]
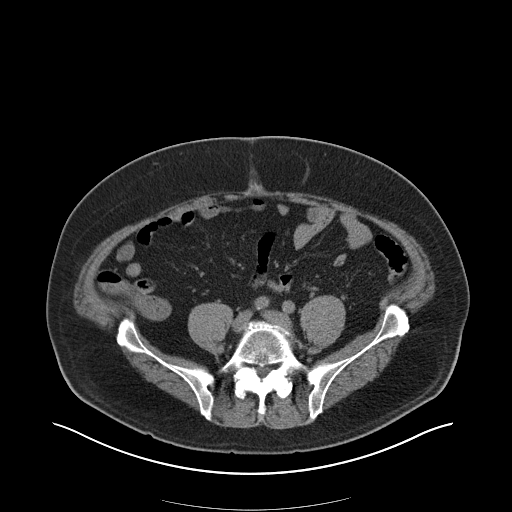
[im 51/101  soft-tissue]
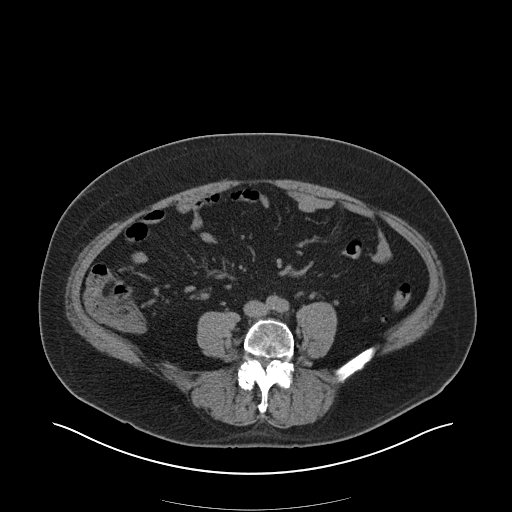
[im 56/101  soft-tissue]
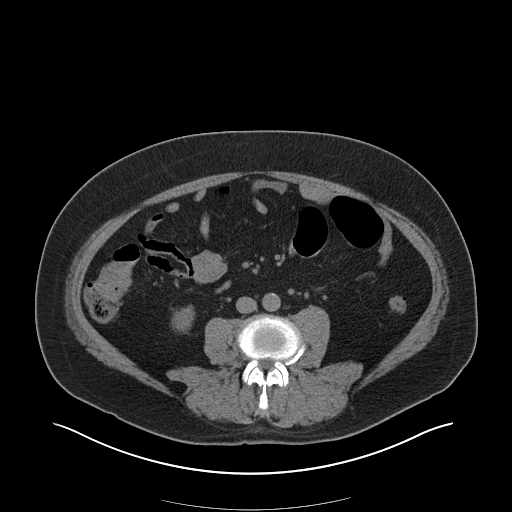
[im 67/101  soft-tissue]
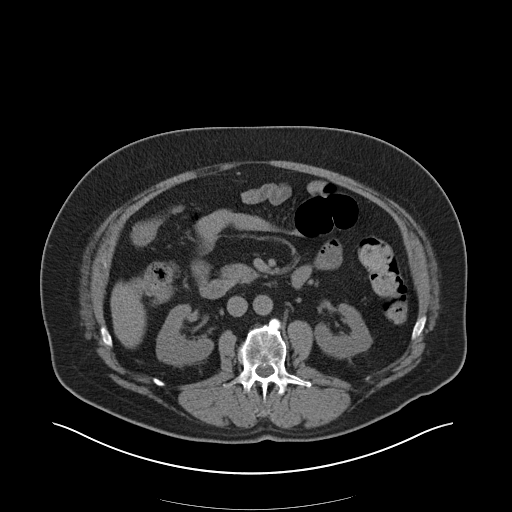
[im 67/101  bone]
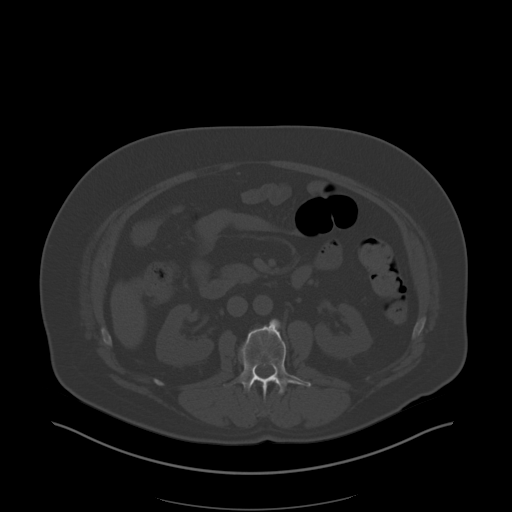
[im 73/101  soft-tissue]
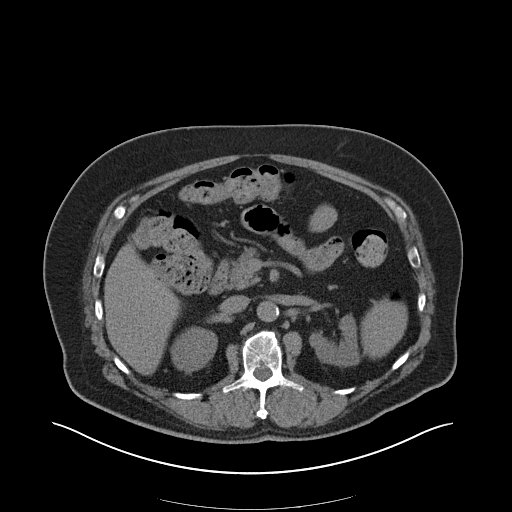
[im 78/101  soft-tissue]
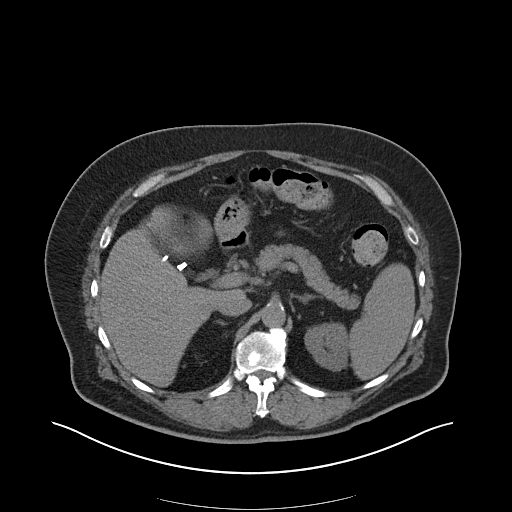
[im 89/101  soft-tissue]
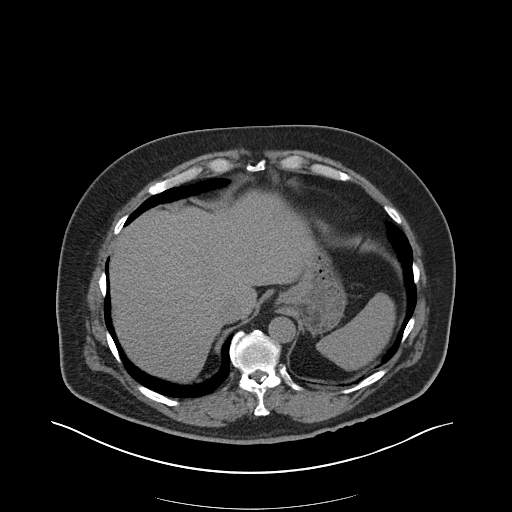
[im 95/101  soft-tissue]
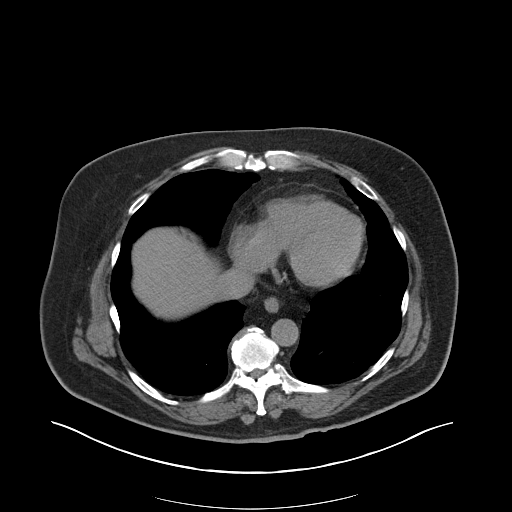

[Series 4: coronal · coronal · 0.81mm/px · 3 of 159 slices shown]
[im 53/159  soft-tissue]
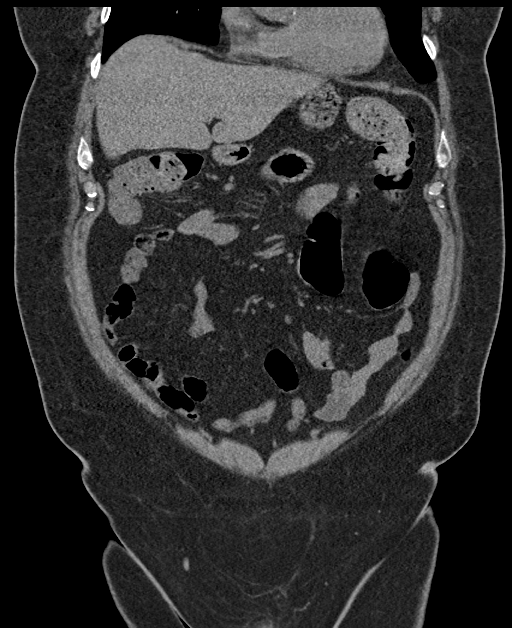
[im 71/159  soft-tissue]
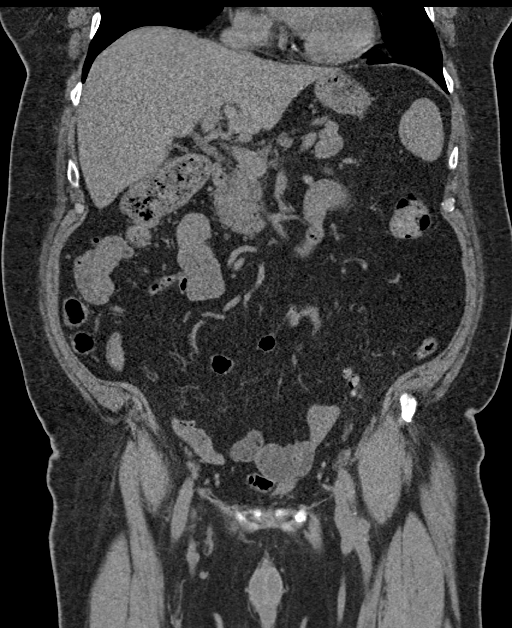
[im 88/159  soft-tissue]
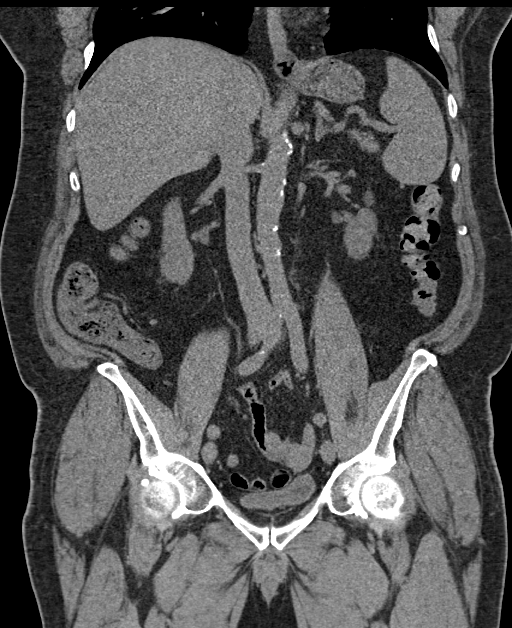

[16 of 46 positions shown; findings below may reference images not displayed]

FINDINGS: Lower chest: No acute pleural or parenchymal lung disease.

Hepatobiliary: Cholecystectomy. Unremarkable unenhanced appearance
of the liver.

Pancreas: Unremarkable unenhanced appearance.

Spleen: Unremarkable unenhanced appearance.

Adrenals/Urinary Tract: Stable nonobstructing 4 mm calculus within
the lower pole left kidney. No right-sided calculi. No obstructive
uropathy within either kidney. The adrenals and bladder are
unremarkable.

Stomach/Bowel: No bowel obstruction or ileus. Normal appendix right
lower quadrant. Scattered diverticulosis of the distal colon without
diverticulitis. No bowel wall thickening or inflammatory change.

Vascular/Lymphatic: Aortic atherosclerosis. No enlarged abdominal or
pelvic lymph nodes.

Reproductive: Prostate is unremarkable.

Other: No free fluid or free intraperitoneal gas. No abdominal wall
hernia.

Musculoskeletal: No acute or destructive bony lesions. Reconstructed
images demonstrate no additional findings.
IMPRESSION: 1. Stable nonobstructing 4 mm left renal calculus.
2. Minimal distal colonic diverticulosis without diverticulitis.
3.  Aortic Atherosclerosis ([id number]).

## 2022-12-06 IMAGING — DX DG CHEST 1V PORT
1 series · 1 of 1 positions shown · non-contrast
Comparison: AP chest 08/23/2021

CLINICAL DATA: Shortness of breath weakness and fever. Productive
cough with white sputum.

EXAM:
PORTABLE CHEST 1 VIEW

[chest ap]
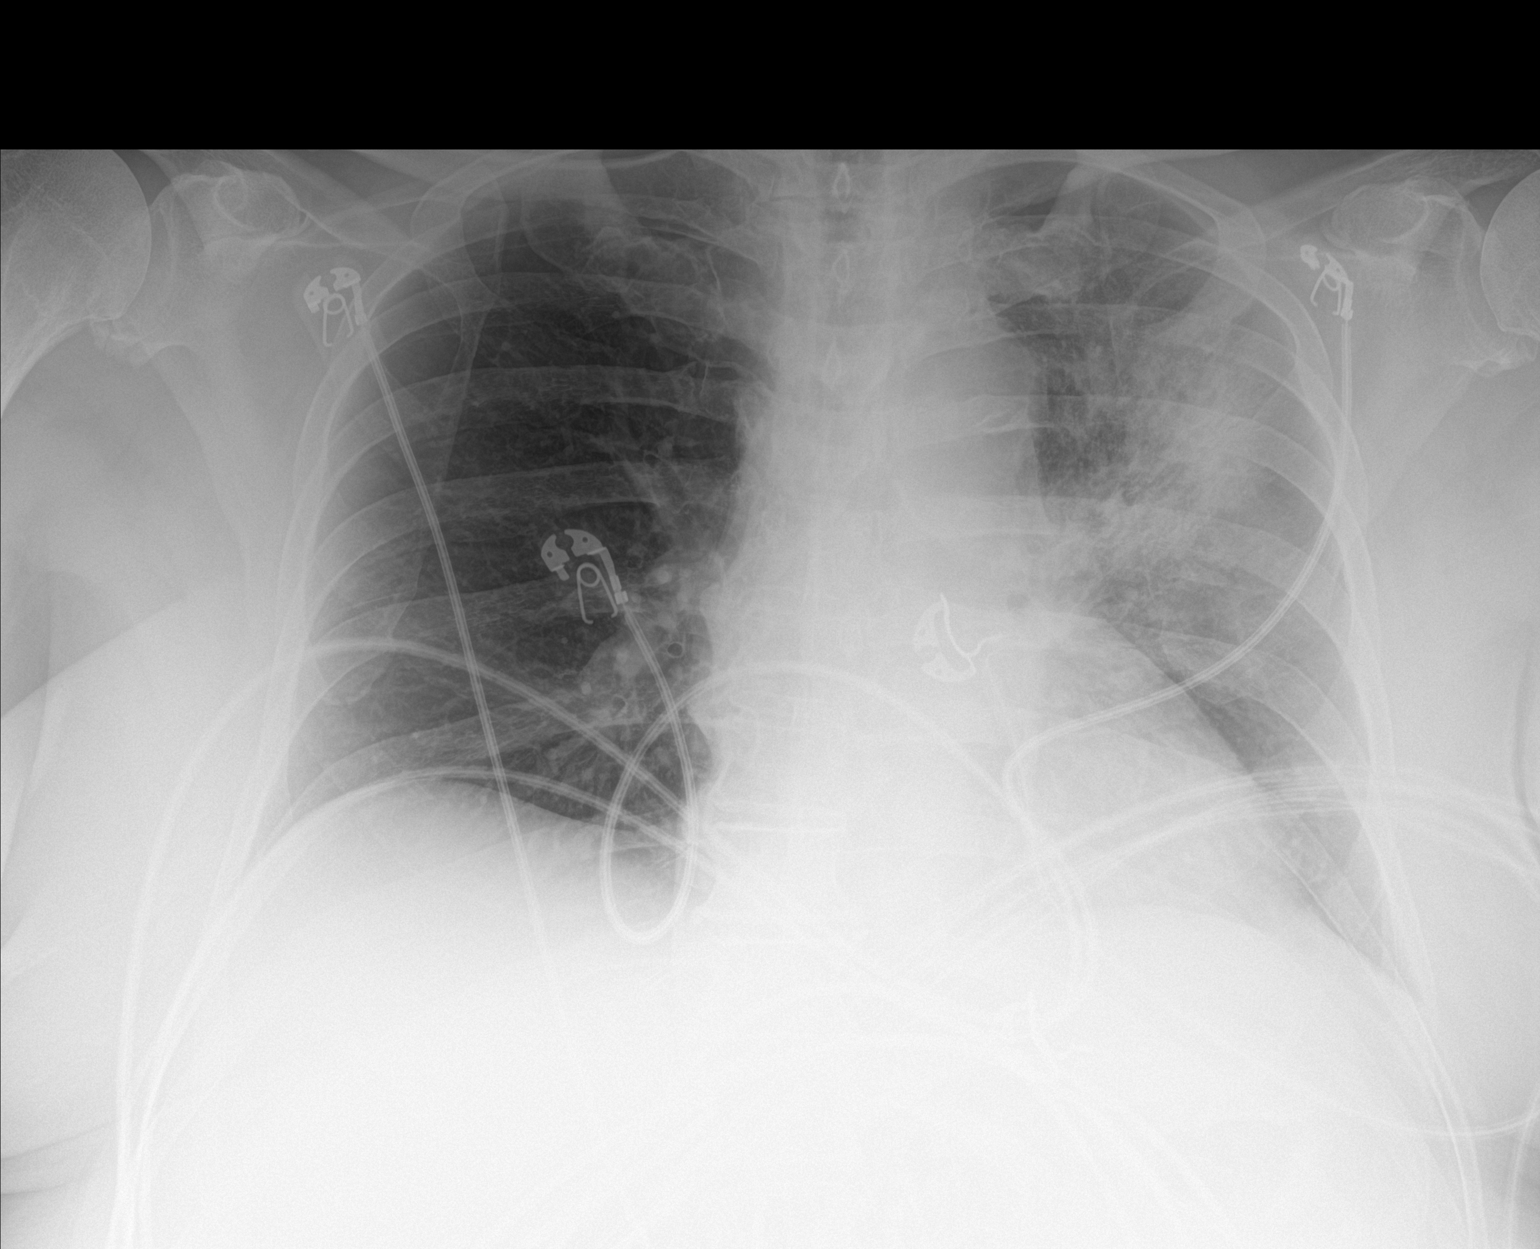

[1 of 1 positions shown; findings below may reference images not displayed]

FINDINGS: Cardiac silhouette and mediastinal contours within normal limits
with mild calcification within aortic arch. New heterogeneous
airspace opacification throughout the superolateral left lung. The
right lung remains clear. No pleural effusion or pneumothorax.
Mild-to-moderate multilevel degenerative disc changes of the
thoracic spine. Mild dextrocurvature of the midthoracic spine.
IMPRESSION: New heterogeneous airspace opacification throughout the
superolateral left lung suspicious for pneumonia. Recommend
follow-up radiographs 6 weeks after treatment to ensure resolution.

## 2022-12-07 IMAGING — US US ABDOMEN LIMITED
1 series · 15 of 20 positions shown · non-contrast
Comparison: CT renal stone protocol from February 21, 2022.

CLINICAL DATA: Elevated liver function tests in a 66-year-old male.

EXAM:
ULTRASOUND ABDOMEN LIMITED RIGHT UPPER QUADRANT

[Series 1: us abdomen limited ruq mc & wl · 15 of 20 slices shown]
[im 1/20]
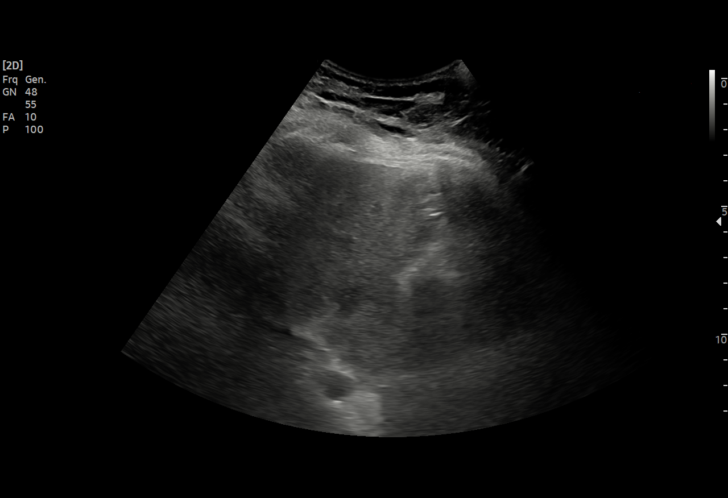
[im 3/20]
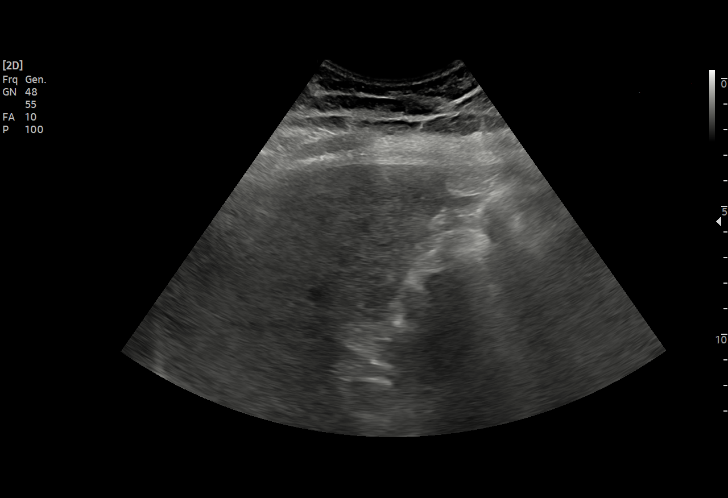
[im 4/20]
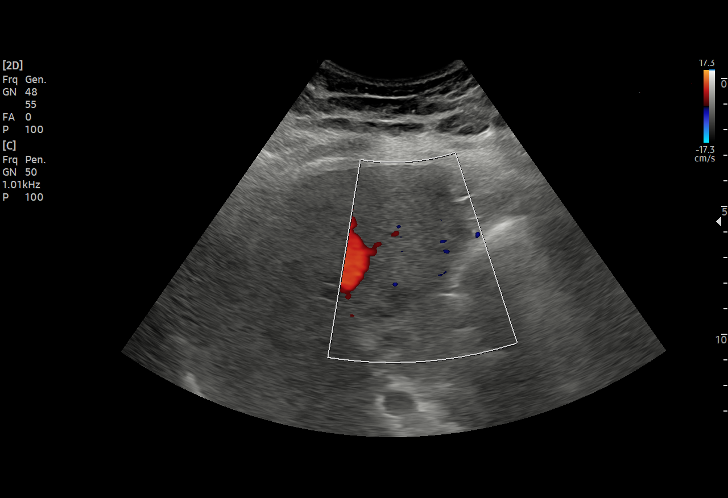
[im 5/20]
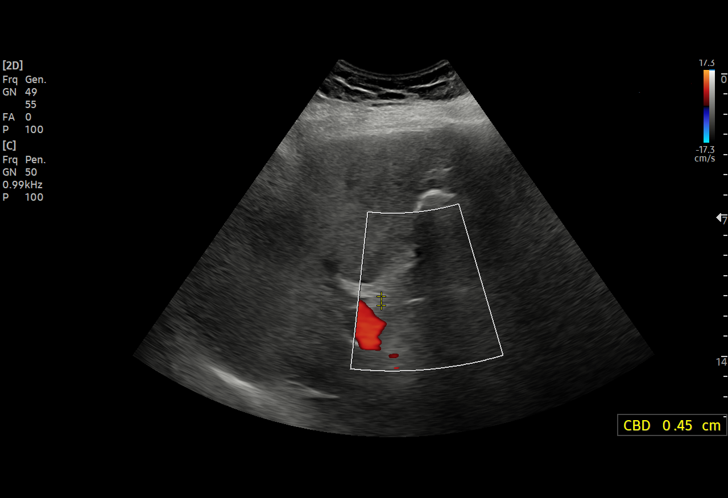
[im 7/20]
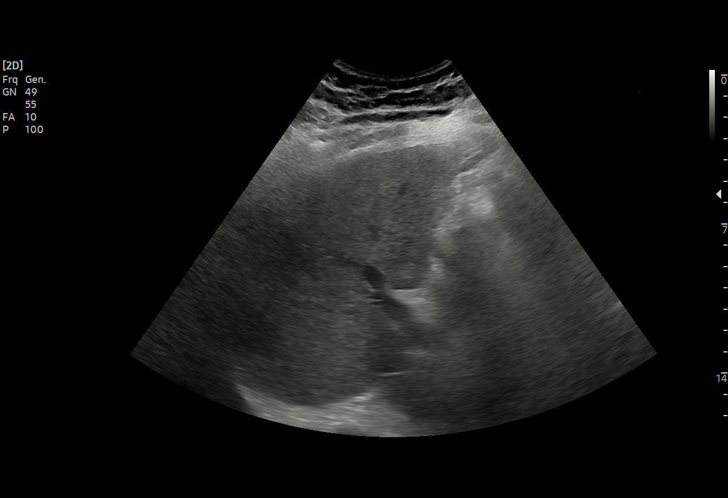
[im 8/20]
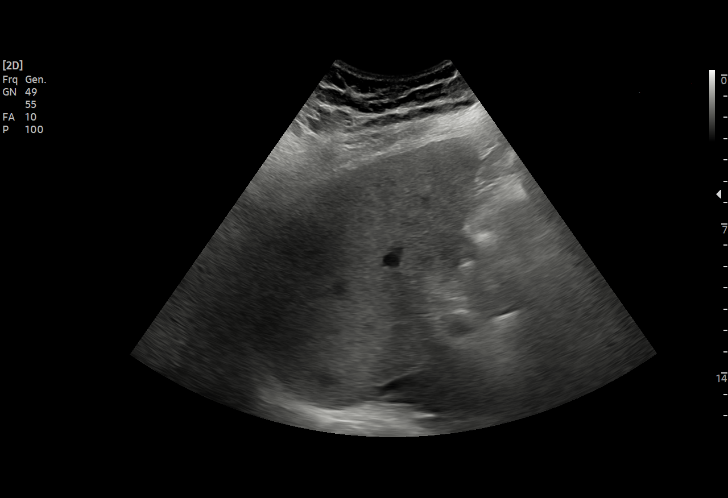
[im 9/20]
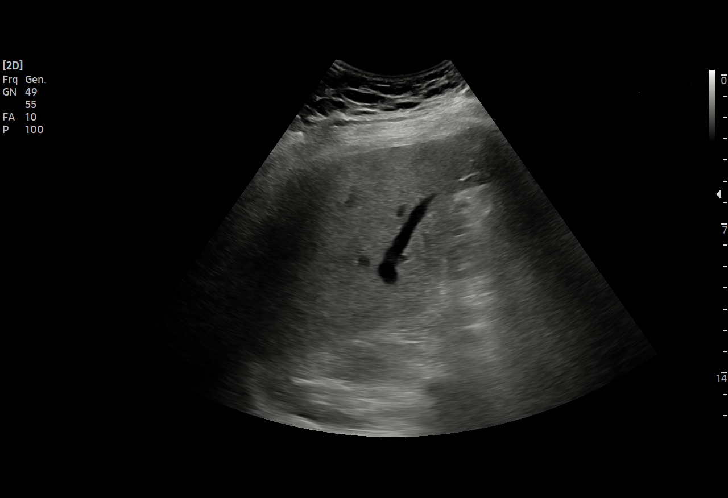
[im 11/20]
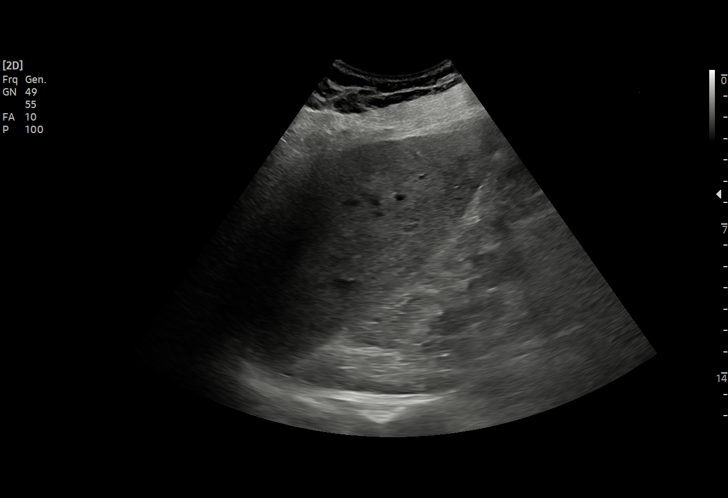
[im 12/20]
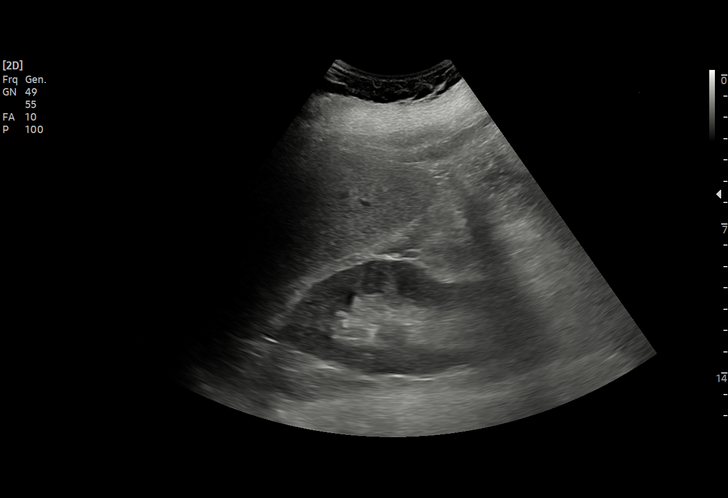
[im 13/20]
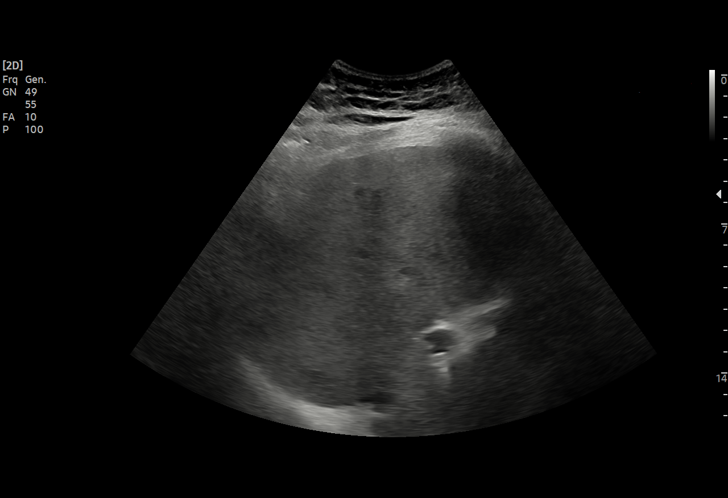
[im 15/20]
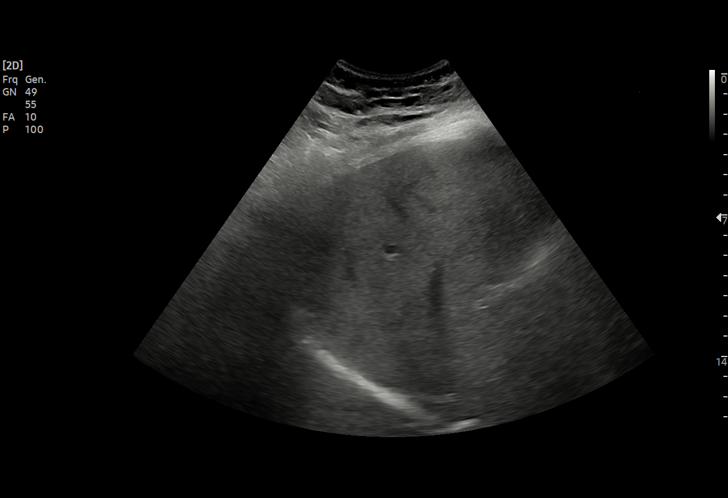
[im 16/20]
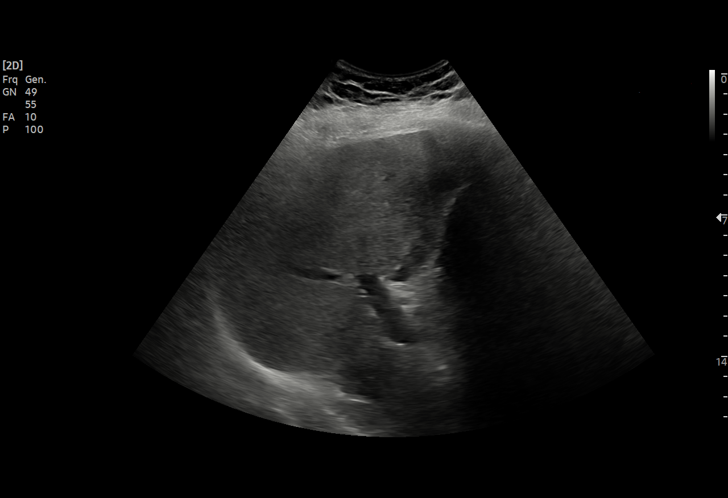
[im 17/20]
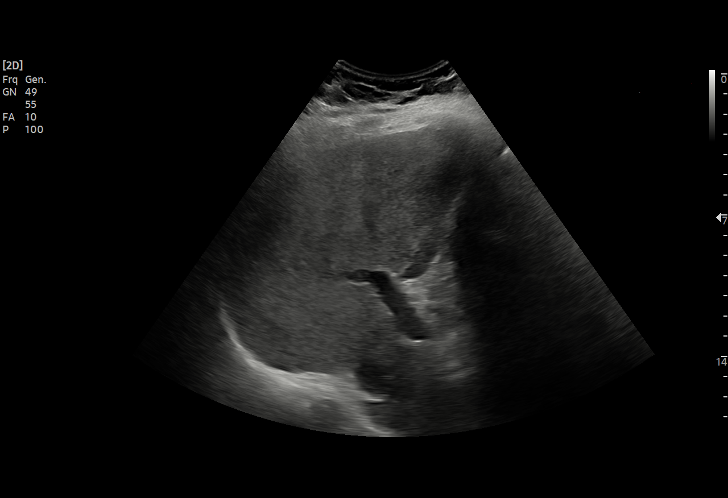
[im 19/20]
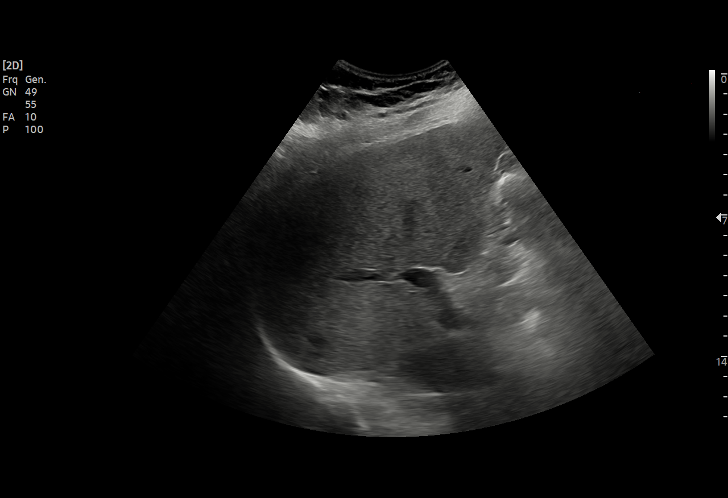
[im 20/20]
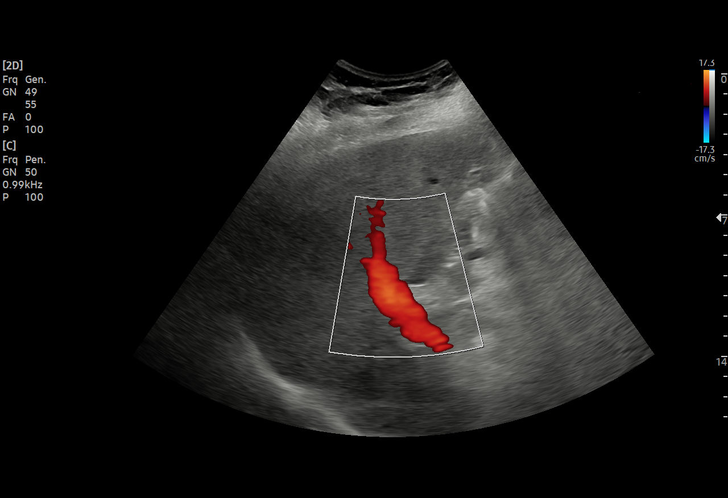

[15 of 20 positions shown; findings below may reference images not displayed]

FINDINGS: Gallbladder:

Post cholecystectomy.

Common bile duct:

Diameter: 4.5 mm

Liver:

Liver with increased echogenicity and coarsened hepatic echotexture.
No visible lesion on limited imaging due to patient body habitus and
sonographic window. Portal vein is patent on color Doppler imaging
with normal direction of blood flow towards the liver.

Other: No perihepatic ascites.
IMPRESSION: Limited exam due to patient body habitus with no biliary duct
dilation following cholecystectomy.

Hepatic steatosis and coarsened hepatic echotexture, correlate with
any clinical or laboratory evidence of liver disease.

## 2022-12-15 ENCOUNTER — Other Ambulatory Visit: Payer: Self-pay

## 2022-12-15 ENCOUNTER — Emergency Department (HOSPITAL_COMMUNITY): Payer: Medicare Other

## 2022-12-15 ENCOUNTER — Emergency Department (HOSPITAL_COMMUNITY)
Admission: EM | Admit: 2022-12-15 | Discharge: 2022-12-15 | Disposition: A | Discharge status: Home / Self Care | Payer: Medicare Other | Attending: Emergency Medicine | Admitting: Emergency Medicine

## 2022-12-15 ENCOUNTER — Encounter (HOSPITAL_COMMUNITY): Payer: Self-pay

## 2022-12-15 DIAGNOSIS — I2510 Atherosclerotic heart disease of native coronary artery without angina pectoris: Secondary | ICD-10-CM | POA: Diagnosis not present

## 2022-12-15 DIAGNOSIS — Z79899 Other long term (current) drug therapy: Secondary | ICD-10-CM | POA: Insufficient documentation

## 2022-12-15 DIAGNOSIS — N132 Hydronephrosis with renal and ureteral calculous obstruction: Secondary | ICD-10-CM | POA: Insufficient documentation

## 2022-12-15 DIAGNOSIS — N200 Calculus of kidney: Secondary | ICD-10-CM

## 2022-12-15 DIAGNOSIS — I10 Essential (primary) hypertension: Secondary | ICD-10-CM | POA: Insufficient documentation

## 2022-12-15 DIAGNOSIS — Z7982 Long term (current) use of aspirin: Secondary | ICD-10-CM | POA: Insufficient documentation

## 2022-12-15 DIAGNOSIS — R1032 Left lower quadrant pain: Secondary | ICD-10-CM | POA: Diagnosis present

## 2022-12-15 LAB — CBC WITH DIFFERENTIAL/PLATELET
Abs Immature Granulocytes: 0.04 10*3/uL (ref 0.00–0.07)
Basophils Absolute: 0 10*3/uL (ref 0.0–0.1)
Basophils Relative: 0 %
Eosinophils Absolute: 0 10*3/uL (ref 0.0–0.5)
Eosinophils Relative: 0 %
HCT: 46.2 % (ref 39.0–52.0)
Hemoglobin: 15.4 g/dL (ref 13.0–17.0)
Immature Granulocytes: 0 %
Lymphocytes Relative: 8 %
Lymphs Abs: 1.1 10*3/uL (ref 0.7–4.0)
MCH: 30 pg (ref 26.0–34.0)
MCHC: 33.3 g/dL (ref 30.0–36.0)
MCV: 89.9 fL (ref 80.0–100.0)
Monocytes Absolute: 1.2 10*3/uL — ABNORMAL HIGH (ref 0.1–1.0)
Monocytes Relative: 9 %
Neutro Abs: 11.3 10*3/uL — ABNORMAL HIGH (ref 1.7–7.7)
Neutrophils Relative %: 83 %
Platelets: 181 10*3/uL (ref 150–400)
RBC: 5.14 MIL/uL (ref 4.22–5.81)
RDW: 13.2 % (ref 11.5–15.5)
WBC: 13.7 10*3/uL — ABNORMAL HIGH (ref 4.0–10.5)
nRBC: 0 % (ref 0.0–0.2)

## 2022-12-15 LAB — URINALYSIS, ROUTINE W REFLEX MICROSCOPIC
Bacteria, UA: NONE SEEN
Bilirubin Urine: NEGATIVE
Glucose, UA: NEGATIVE mg/dL
Ketones, ur: NEGATIVE mg/dL
Leukocytes,Ua: NEGATIVE
Nitrite: NEGATIVE
Protein, ur: 30 mg/dL — AB
RBC / HPF: >50 RBC/hpf (ref 0–5)
Specific Gravity, Urine: 1.024 (ref 1.005–1.030)
pH: 5 (ref 5.0–8.0)

## 2022-12-15 LAB — COMPREHENSIVE METABOLIC PANEL
ALT: 33 U/L (ref 0–44)
AST: 33 U/L (ref 15–41)
Albumin: 4.1 g/dL (ref 3.5–5.0)
Alkaline Phosphatase: 105 U/L (ref 38–126)
Anion gap: 7 (ref 5–15)
BUN: 17 mg/dL (ref 8–23)
CO2: 24 mmol/L (ref 22–32)
Calcium: 9.2 mg/dL (ref 8.9–10.3)
Chloride: 108 mmol/L (ref 98–111)
Creatinine, Ser: 1.31 mg/dL — ABNORMAL HIGH (ref 0.61–1.24)
GFR, Estimated: 60 mL/min — ABNORMAL LOW (ref >60)
Glucose, Bld: 115 mg/dL — ABNORMAL HIGH (ref 70–99)
Potassium: 4.1 mmol/L (ref 3.5–5.1)
Sodium: 139 mmol/L (ref 135–145)
Total Bilirubin: 1.1 mg/dL (ref 0.3–1.2)
Total Protein: 7.2 g/dL (ref 6.5–8.1)

## 2022-12-15 LAB — LIPASE, BLOOD: Lipase: 35 U/L (ref 11–51)

## 2022-12-15 MED ORDER — ONDANSETRON HCL 4 MG PO TABS: 4.0000 mg | ORAL_TABLET | ORAL | 0 refills | Status: DC | PRN

## 2022-12-15 MED ORDER — TRAMADOL HCL 50 MG PO TABS: 50.0000 mg | ORAL_TABLET | Freq: Four times a day (QID) | ORAL | 0 refills | Status: DC | PRN

## 2022-12-15 MED ORDER — HYDROMORPHONE HCL 1 MG/ML IJ SOLN
0.5000 mg | Freq: Once | INTRAMUSCULAR | Status: AC
  Administered 2022-12-15: 0.5 mg via INTRAVENOUS
  Filled 2022-12-15: qty 1

## 2022-12-15 MED ORDER — TAMSULOSIN HCL 0.4 MG PO CAPS: 0.4000 mg | ORAL_CAPSULE | Freq: Two times a day (BID) | ORAL | 0 refills | Status: AC

## 2022-12-15 MED ORDER — SODIUM CHLORIDE 0.9 % IV BOLUS
1000.0000 mL | Freq: Once | INTRAVENOUS | Status: AC
  Administered 2022-12-15: 1000 mL via INTRAVENOUS

## 2022-12-15 NOTE — ED Triage Notes (Signed)
Sent to ER by UC to r/o kidney stone.  C/o left flank pain that started this am with nausea.  Denies urinary s/sy Administered phenergan and Toradol PTA with relief.  Hot bath with relief.

## 2022-12-15 NOTE — ED Provider Notes (Signed)
[facility] Provider Note  CSN: [id number] Arrival date & time: 12/15/22 1323  Chief Complaint(s) Flank Pain  HPI Stephen Nash is a 68 y.o. male with past medical history as below, significant for kidney stone, GERD, OSA, Nissen fundoplication, CAD who presents to the ED with complaint of left-sided flank pain.  Patient reports since this morning he has had left-sided flank pain, sharp, stabbing, rating to left lower quadrant.  Sensation similar to prior kidney stone.  Seen urgent care earlier today and given analgesics and antiemetics did improve his symptoms.  He has nausea no vomiting.  Dysuria but no hematuria.  No change to bowel function.  No fevers.  No abdominal trauma.  Does not follow with urology regularly  Past Medical History Past Medical History:  Diagnosis Date  . Dyspnea   . Elevated cholesterol   . GERD (gastroesophageal reflux disease)   . High blood pressure   . History of blood clots   . History of hiatal hernia   . History of kidney stones   . Kidney stone   . Pneumonia   . PONV (postoperative nausea and vomiting)   . Sleep apnea    Patient Active Problem List   Diagnosis Date Noted  . S/P Nissen fundoplication (without gastrostomy tube) procedure 06/17/2022  . OSA (obstructive sleep apnea) 02/25/2022  . Hypokalemia 02/25/2022  . AKI (acute kidney injury) ([place]) 02/24/2022  . CAP (community acquired pneumonia) 02/24/2022  . Transaminitis 02/24/2022  . Flank pain 02/24/2022  . Sepsis ([place]) 02/24/2022  . Elevated troponin 02/24/2022  . CAD S/P percutaneous coronary angioplasty 02/24/2022  . Esophageal dysphagia   . Gastroesophageal reflux disease    Home Medication(s) Prior to Admission medications   Medication Sig Start Date End Date Taking? Authorizing Provider  allopurinol (ZYLOPRIM) 300 MG tablet Take 300 mg by mouth daily.    [provider]  ammonium lactate (LAC-HYDRIN) 12 % lotion Apply 1  application. topically daily as needed for dry skin. 11/29/21   [provider]  aspirin 81 MG EC tablet Take 81 mg by mouth daily.    [provider]  atorvastatin (LIPITOR) 80 MG tablet Take 1 tablet (80 mg total) by mouth every evening. 03/07/22   Geradine Girt, DO  doxycycline (VIBRA-TABS) 100 MG tablet Take 1 tablet (100 mg total) by mouth every 12 (twelve) hours. Patient not taking: Reported on 06/05/2022 02/28/22   Geradine Girt, DO  HYDROcodone-acetaminophen (HYCET) 7.5-325 mg/15 ml solution Take 15 mLs by mouth 4 (four) times daily as needed for moderate pain. 06/18/22 06/18/23  Ralene Ok, MD  hydrOXYzine (ATARAX) 50 MG tablet Take 50 mg by mouth at bedtime as needed for sleep. 04/14/22   [provider]  Lactobacillus (ACIDOPHILUS) CAPS capsule Take 1 capsule by mouth daily. Patient not taking: Reported on 06/05/2022 02/28/22   Geradine Girt, DO  losartan (COZAAR) 100 MG tablet Take 50 mg by mouth daily. 03/25/22   [provider]  pantoprazole (PROTONIX) 40 MG tablet Take 1 tablet (40 mg total) by mouth 2 (two) times daily. 02/28/22   Geradine Girt, DO  sucralfate (CARAFATE) 1 GM/10ML suspension TAKE 10ML BY MOUTH 2 TIMES A DAY Patient taking differently: Take 1 g by mouth at bedtime as needed (ulcers). 02/05/22   Mauri Pole, MD  torsemide (DEMADEX) 10 MG tablet Take 0.5 tablets (5 mg total) by mouth daily. 03/07/22   Geradine Girt, DO  Past Surgical History Past Surgical History:  Procedure Laterality Date  . CHOLECYSTECTOMY    . coronary artery stent    . ESOPHAGEAL MANOMETRY N/A 10/25/2021   Procedure: ESOPHAGEAL MANOMETRY (EM);  Surgeon: Mauri Pole, MD;  Location: [facility];  Service: Endoscopy;  Laterality: N/A;  . EYE SURGERY Left    repaired muscles in left eye  . HERNIA REPAIR    . INSERTION  OF MESH N/A 06/17/2022   Procedure: INSERTION OF MESH;  Surgeon: Ralene Ok, MD;  Location: [facility];  Service: General;  Laterality: N/A;  . XI ROBOTIC ASSISTED HIATAL HERNIA REPAIR N/A 06/17/2022   Procedure: XI ROBOTIC ASSISTED HIATAL HERNIA REPAIR WITH MESH AND FUNDOPLICATION;  Surgeon: Ralene Ok, MD;  Location: [facility];  Service: General;  Laterality: N/A;   Family History Family History  Problem Relation Age of Onset  . Heart failure Mother   . Stroke Father   . Heart failure Father   . Colon cancer Maternal Grandfather   . Kidney cancer Other        uncle    Social History Social History   Tobacco Use  . Smoking status: Never  . Smokeless tobacco: Never  Vaping Use  . Vaping Use: Never used  Substance Use Topics  . Alcohol use: Never  . Drug use: Never   Allergies Isosorbide and Percocet [oxycodone-acetaminophen]  Review of Systems Review of Systems  Constitutional:  Negative for chills and fever.  HENT:  Negative for facial swelling and trouble swallowing.   Eyes:  Negative for photophobia and visual disturbance.  Respiratory:  Negative for cough and shortness of breath.   Cardiovascular:  Negative for chest pain and palpitations.  Gastrointestinal:  Positive for abdominal pain and nausea. Negative for vomiting.  Endocrine: Negative for polydipsia and polyuria.  Genitourinary:  Positive for dysuria and flank pain. Negative for difficulty urinating and hematuria.  Musculoskeletal:  Negative for gait problem and joint swelling.  Skin:  Negative for pallor and rash.  Neurological:  Negative for syncope and headaches.  Psychiatric/Behavioral:  Negative for agitation and confusion.     Physical Exam Vital Signs  I have reviewed the triage vital signs BP 120/80 (BP Location: Right Arm)   Pulse 75   Temp 97.9 F (36.6 C) (Oral)   Resp 16   Ht 5\' 6"  (1.676 m)   Wt 97.1 kg   SpO2 97%   BMI 34.54 kg/m  Physical Exam Vitals and nursing note reviewed.   Constitutional:      General: He is not in acute distress.    Appearance: Normal appearance. He is well-developed.  HENT:     Head: Normocephalic and atraumatic.     Right Ear: External ear normal.     Left Ear: External ear normal.     Mouth/Throat:     Mouth: Mucous membranes are moist.  Eyes:     General: No scleral icterus. Cardiovascular:     Rate and Rhythm: Normal rate and regular rhythm.     Pulses: Normal pulses.     Heart sounds: Normal heart sounds.  Pulmonary:     Effort: Pulmonary effort is normal. No respiratory distress.     Breath sounds: Normal breath sounds.  Abdominal:     General: Abdomen is flat.     Palpations: Abdomen is soft.     Tenderness: There is abdominal tenderness. There is no guarding or rebound.  Musculoskeletal:     Right lower leg: No edema.  Left lower leg: No edema.  Skin:    General: Skin is warm and dry.     Capillary Refill: Capillary refill takes less than 2 seconds.  Neurological:     Mental Status: He is alert and oriented to person, place, and time.  Psychiatric:        Mood and Affect: Mood normal.        Behavior: Behavior normal.     ED Results and Treatments Labs (all labs ordered are listed, but only abnormal results are displayed) Labs Reviewed  CBC WITH DIFFERENTIAL/PLATELET - Abnormal; Notable for the following components:      Result Value   WBC 13.7 (*)    Neutro Abs 11.3 (*)    Monocytes Absolute 1.2 (*)    All other components within normal limits  COMPREHENSIVE METABOLIC PANEL - Abnormal; Notable for the following components:   Glucose, Bld 115 (*)    Creatinine, Ser 1.31 (*)    GFR, Estimated 60 (*)    All other components within normal limits  LIPASE, BLOOD  URINALYSIS, ROUTINE W REFLEX MICROSCOPIC                                                                                                                          Radiology CT Renal Stone Study  Result Date: 12/15/2022 CLINICAL DATA:  Flank  pain EXAM: CT ABDOMEN AND PELVIS WITHOUT CONTRAST TECHNIQUE: Multidetector CT imaging of the abdomen and pelvis was performed following the standard protocol without IV contrast. RADIATION DOSE REDUCTION: This exam was performed according to the departmental dose-optimization program which includes automated exposure control, adjustment of the mA and/or kV according to patient size and/or use of iterative reconstruction technique. COMPARISON:  02/21/2022 FINDINGS: Lower chest: Linear dependent bibasilar subsegmental atelectasis or scarring. No pericardial or pleural effusion. Atheromatous calcifications of the coronary arteries. Hepatobiliary: No focal liver abnormality is seen. Status post cholecystectomy. No biliary dilatation. Pancreas: Unremarkable. No pancreatic ductal dilatation or surrounding inflammatory changes. Spleen: Normal in size without focal abnormality. Adrenals/Urinary Tract: No adrenal lesions. Left-sided intrarenal 5 mm stone that was present previously has progressed into the distal left ureter and there is mild left-sided hydronephrosis. Unremarkable urinary bladder. Stomach/Bowel: Stomach is within normal limits. Appendix appears normal. No evidence of bowel wall thickening, distention, or inflammatory changes. Vascular/Lymphatic: Aortic atherosclerosis. No enlarged abdominal or pelvic lymph nodes. Reproductive: Prostate is unremarkable. Other: No abdominal wall hernia or abnormality. No abdominopelvic ascites. Musculoskeletal: Thoracolumbosacral degenerative changes. No acute osseous abnormalities identified IMPRESSION: Distal left ureteral 5 mm stone with mild hydronephrosis. Electronically Signed   By: Sammie Bench M.D.   On: 12/15/2022 16:46    Pertinent labs & imaging results that were available during my care of the patient were reviewed by me and considered in my medical decision making (see MDM for details).  Medications Ordered in ED Medications  sodium chloride 0.9 % bolus  1,000 mL (0 mLs Intravenous Stopped 12/15/22 1700)  HYDROmorphone (DILAUDID) injection  0.5 mg (0.5 mg Intravenous Given 12/15/22 1545)                                                                                                                                     Procedures Procedures  (including critical care time)  Medical Decision Making / ED Course    Medical Decision Making:    Majid Boehl is a 67 y.o. male with past medical history as below, significant for kidney stone, GERD, OSA, Nissen fundoplication, CAD who presents to the ED with complaint of left-sided flank pain.. The complaint involves an extensive differential diagnosis and also carries with it a high risk of complications and morbidity.  Serious etiology was considered. Ddx includes but is not limited to: Differential diagnosis includes but is not exclusive to acute appendicitis, renal colic, testicular torsion, urinary tract infection, prostatitis,  diverticulitis, small bowel obstruction, colitis, abdominal aortic aneurysm, gastroenteritis, constipation etc.   Complete initial physical exam performed, notably the patient  was HDS, abd not peritoneal .    Reviewed and confirmed nursing documentation for past medical history, family history, social history.  Vital signs reviewed.    Clinical Course as of 12/15/22 1725  Mon Dec 15, 2022  1655 Creatinine(!): 1.31 Cr elev from baseline, give IVF. He has obstructing stone [SG]  1724 WBC(!): 13.7 No fever, heart rate initially elevated but could be secondary to pain. [SG]  1724 Symptoms proved following analgesics.  Nausea has resolved. [SG]  1724 CT renal with nephrolithiasis, UA is pending  [SG]    Clinical Course User Index [SG] Jeanell Sparrow, DO     Additional history obtained: -Additional history obtained from family -External records from outside source obtained and reviewed including: Chart review including previous notes, labs, imaging, consultation notes  including prior ed visits, labs imaging and medications   Lab Tests: -I ordered, reviewed, and interpreted labs.   The pertinent results include:   Labs Reviewed  CBC WITH DIFFERENTIAL/PLATELET - Abnormal; Notable for the following components:      Result Value   WBC 13.7 (*)    Neutro Abs 11.3 (*)    Monocytes Absolute 1.2 (*)    All other components within normal limits  COMPREHENSIVE METABOLIC PANEL - Abnormal; Notable for the following components:   Glucose, Bld 115 (*)    Creatinine, Ser 1.31 (*)    GFR, Estimated 60 (*)    All other components within normal limits  LIPASE, BLOOD  URINALYSIS, ROUTINE W REFLEX MICROSCOPIC    Notable for leukocytosis, cr elev  EKG   EKG Interpretation  Date/Time:    Ventricular Rate:    PR Interval:    QRS Duration:   QT Interval:    QTC Calculation:   R Axis:     Text Interpretation:           Imaging Studies ordered: I ordered imaging studies including CT renal I independently  visualized the following imaging with scope of interpretation limited to determining acute life threatening conditions related to emergency care: nephrolithiasis  I independently visualized and interpreted imaging. I agree with the radiologist interpretation   Medicines ordered and prescription drug management: Meds ordered this encounter  Medications  . sodium chloride 0.9 % bolus 1,000 mL  . HYDROmorphone (DILAUDID) injection 0.5 mg    -I have reviewed the patients home medicines and have made adjustments as needed   Consultations Obtained: na   Cardiac Monitoring: The patient was maintained on a cardiac monitor.  I personally viewed and interpreted the cardiac monitored which showed an underlying rhythm of: nsr  Social Determinants of Health:  Diagnosis or treatment significantly limited by social determinants of health: na   Reevaluation: After the interventions noted above, I reevaluated the patient and found that they have  improved  Co morbidities that complicate the patient evaluation . Past Medical History:  Diagnosis Date  . Dyspnea   . Elevated cholesterol   . GERD (gastroesophageal reflux disease)   . High blood pressure   . History of blood clots   . History of hiatal hernia   . History of kidney stones   . Kidney stone   . Pneumonia   . PONV (postoperative nausea and vomiting)   . Sleep apnea       Dispostion: Disposition decision including need for hospitalization was considered, and patient {[id number]::"discharged from emergency department."}    Final Clinical Impression(s) / ED Diagnoses Final diagnoses:  Nephrolithiasis     This chart was dictated using voice recognition software.  Despite best efforts to proofread,  errors can occur which can change the documentation meaning.

## 2022-12-15 NOTE — Discharge Instructions (Addendum)
It was a pleasure caring for you today in the emergency department. ° °Please return to the emergency department for any worsening or worrisome symptoms. ° ° °

## 2023-07-13 ENCOUNTER — Other Ambulatory Visit: Payer: Self-pay | Admitting: Urology

## 2023-07-14 ENCOUNTER — Encounter (HOSPITAL_BASED_OUTPATIENT_CLINIC_OR_DEPARTMENT_OTHER): Payer: Self-pay | Admitting: Urology

## 2023-07-14 NOTE — Progress Notes (Signed)
07/14/2023 3:18 PM Pre procedure call completed. Stephen Nash. Updated on date and time of arrival 07/17/23 at 0600. Times for NPO and clear liquid consumption until MN DOS reviewed. Stephen Nash. Allergies, medical hx and medication list reviewed. Medications ok to take day of surgery (NONE) and those to hold prior to surgery reviewed. Ride secured for day of surgery(wife Diane). Questions and concerns addressed by patient. Per cardiologist Stephen Nash. Is not to stop ASA 81 mg pre-procedure. Stephen Nash. States Dr. Marlou Porch is aware. Stephen Nash. States recent EKG at primary care MD office. [facility] contacted and made aware to request copy for records DOS per protocol. Stephen Nash. To bring his CPAP machine DOS.  Stephen Nash. Verbalized understanding of all instructions.   Audriana Aldama, Blanchard Kelch

## 2023-07-17 ENCOUNTER — Other Ambulatory Visit: Payer: Self-pay

## 2023-07-17 ENCOUNTER — Ambulatory Visit (HOSPITAL_BASED_OUTPATIENT_CLINIC_OR_DEPARTMENT_OTHER)
Admission: RE | Admit: 2023-07-17 | Discharge: 2023-07-17 | Disposition: A | Discharge status: Home / Self Care | Payer: Medicare Other | Attending: Urology | Admitting: Urology

## 2023-07-17 ENCOUNTER — Encounter (HOSPITAL_BASED_OUTPATIENT_CLINIC_OR_DEPARTMENT_OTHER): Payer: Self-pay | Admitting: Urology

## 2023-07-17 ENCOUNTER — Ambulatory Visit (HOSPITAL_COMMUNITY): Payer: Medicare Other

## 2023-07-17 ENCOUNTER — Encounter (HOSPITAL_BASED_OUTPATIENT_CLINIC_OR_DEPARTMENT_OTHER)
Admission: RE | Disposition: A | Discharge status: Home / Self Care | Payer: Self-pay | Source: Home / Self Care | Attending: Urology

## 2023-07-17 DIAGNOSIS — I10 Essential (primary) hypertension: Secondary | ICD-10-CM | POA: Diagnosis not present

## 2023-07-17 DIAGNOSIS — N201 Calculus of ureter: Secondary | ICD-10-CM | POA: Insufficient documentation

## 2023-07-17 DIAGNOSIS — Z955 Presence of coronary angioplasty implant and graft: Secondary | ICD-10-CM | POA: Diagnosis not present

## 2023-07-17 DIAGNOSIS — N200 Calculus of kidney: Secondary | ICD-10-CM

## 2023-07-17 DIAGNOSIS — G4730 Sleep apnea, unspecified: Secondary | ICD-10-CM | POA: Diagnosis not present

## 2023-07-17 HISTORY — DX: Other complications of anesthesia, initial encounter: T88.59XA

## 2023-07-17 HISTORY — DX: Unspecified osteoarthritis, unspecified site: M19.90

## 2023-07-17 HISTORY — PX: EXTRACORPOREAL SHOCK WAVE LITHOTRIPSY: SHX1557

## 2023-07-17 HISTORY — DX: Atherosclerotic heart disease of native coronary artery without angina pectoris: I25.10

## 2023-07-17 SURGERY — LITHOTRIPSY, ESWL
Anesthesia: LOCAL | Laterality: Left

## 2023-07-17 MED ORDER — DIPHENHYDRAMINE HCL 25 MG PO CAPS
ORAL_CAPSULE | ORAL | Status: AC
  Filled 2023-07-17: qty 1

## 2023-07-17 MED ORDER — ONDANSETRON HCL 4 MG PO TABS: 4.0000 mg | ORAL_TABLET | Freq: Three times a day (TID) | ORAL | 0 refills | Status: DC | PRN

## 2023-07-17 MED ORDER — DIAZEPAM 5 MG PO TABS
ORAL_TABLET | ORAL | Status: AC
  Filled 2023-07-17: qty 2

## 2023-07-17 MED ORDER — TRAMADOL HCL 50 MG PO TABS: 50.0000 mg | ORAL_TABLET | Freq: Four times a day (QID) | ORAL | 0 refills | Status: DC | PRN

## 2023-07-17 MED ORDER — ONDANSETRON 4 MG PO TBDP
ORAL_TABLET | ORAL | Status: AC
  Filled 2023-07-17: qty 1

## 2023-07-17 MED ORDER — CIPROFLOXACIN HCL 500 MG PO TABS
ORAL_TABLET | ORAL | Status: AC
  Filled 2023-07-17: qty 1

## 2023-07-17 MED ORDER — DIPHENHYDRAMINE HCL 25 MG PO CAPS
25.0000 mg | ORAL_CAPSULE | ORAL | Status: AC
  Administered 2023-07-17: 25 mg via ORAL

## 2023-07-17 MED ORDER — CIPROFLOXACIN HCL 500 MG PO TABS
500.0000 mg | ORAL_TABLET | Freq: Once | ORAL | Status: AC
  Administered 2023-07-17: 500 mg via ORAL

## 2023-07-17 MED ORDER — ONDANSETRON 4 MG PO TBDP
4.0000 mg | ORAL_TABLET | Freq: Once | ORAL | Status: AC
  Administered 2023-07-17: 4 mg via ORAL

## 2023-07-17 MED ORDER — SODIUM CHLORIDE 0.9 % IV SOLN
INTRAVENOUS | Status: DC
  Administered 2023-07-17: 1000 mL via INTRAVENOUS

## 2023-07-17 MED ORDER — DIAZEPAM 5 MG PO TABS
10.0000 mg | ORAL_TABLET | ORAL | Status: AC
  Administered 2023-07-17: 10 mg via ORAL

## 2023-07-17 MED ORDER — CIPROFLOXACIN IN D5W 400 MG/200ML IV SOLN: 400.0000 mg | INTRAVENOUS | Status: DC

## 2023-07-17 NOTE — H&P (Signed)
Kidney stones   Stephen Nash is a 67 year old male with a history of kidney stones.   07/06/2023: The patient is here today for routine follow-up. Former [employer] patient. Seen in March for 5 mm left distal ureteral calculus. He denies interval UTIs, flank pain, stone passage or hematuria.     ALLERGIES: Percocet    MEDICATIONS: Allopurinol  Aspirin Ec 81 mg tablet, delayed release  Hydroxyzine Hcl 50 mg tablet 1 tablet PO Daily  Losartan Potassium 100 mg tablet 1 tablet PO Daily  Tamsulosin Hcl 0.4 mg capsule 1 capsule PO Daily     GU PSH: No GU PSH    NON-GU PSH: Cataract surgery, Bilateral - 2023 Inguinal hernia repair (laparoscopic) - 2023 Sinus surgery     GU PMH: Ureteral calculus - 01/05/2023    NON-GU PMH: GERD Hypercholesterolemia Hypertension Sleep Apnea    FAMILY HISTORY: Heart Attack - Mother Kidney Stones - Father stroke - Mother   SOCIAL HISTORY: No Social History    REVIEW OF SYSTEMS:    GU Review Male:   Patient denies frequent urination, hard to postpone urination, burning/ pain with urination, get up at night to urinate, leakage of urine, stream starts and stops, trouble starting your stream, have to strain to urinate , erection problems, and penile pain.  Gastrointestinal (Upper):   Patient denies nausea, vomiting, and indigestion/ heartburn.  Gastrointestinal (Lower):   Patient denies diarrhea and constipation.  Constitutional:   Patient denies fever, night sweats, weight loss, and fatigue.  Skin:   Patient denies skin rash/ lesion and itching.  Eyes:   Patient denies blurred vision and double vision.  Ears/ Nose/ Throat:   Patient denies sore throat and sinus problems.  Hematologic/Lymphatic:   Patient denies swollen glands and easy bruising.  Cardiovascular:   Patient denies leg swelling and chest pains.  Respiratory:   Patient denies cough and shortness of breath.  Endocrine:   Patient denies excessive thirst.  Musculoskeletal:   Patient denies back  pain and joint pain.  Neurological:   Patient denies headaches and dizziness.  Psychologic:   Patient denies depression and anxiety.   VITAL SIGNS: None   Complexity of Data:  Records Review:   Previous Hospital Records, Previous Patient Records  X-Ray Review: KUB: Reviewed Films. Discussed With Patient.  C.T. Abdomen/Pelvis: Reviewed Films. Reviewed Report. Discussed With Patient.     PROCEDURES:         KUB - [id number]  A single view of the abdomen is obtained.      Patient confirmed No Neulasta OnPro Device.   There is a 4 mm calcification along the expected course of the left distal ureter, consistent with the stone seen on CT in March. There are also multiple small calcifications measuring approximately 3 to 4 mm in the right renal shadow. There are no calcifications seen within the left renal shadow, along the expected course of the right ureter or within the bladder.          Renal Ultrasound - 16109  Right Kidney: Length: 11.08 cm Depth: 5.08 cm Cortical Width: 1.37 cm Width: 4.50 cm  Left Kidney: Length: 12.06 cm Depth: 3.82 cm Cortical Width: 1.38 cm Width: 4.49 cm  Left Kidney/Ureter:  Subcentimeter hyperechoic areas with and without shadowing - ? stones vs vascular calcifications   Right Kidney/Ureter:  1)? 0.44cm Upper Pole Stone and ? 0.73cm Mid Pole Stone-----2) Subcentimeter hyperechoic areas with and without shadowing - ? stones vs vascular calcifications   Bladder:  PVR = Bladder not Visualized      Patient confirmed No Neulasta OnPro Device.   No hydronephrosis seen, bilaterally. No solid parenchymal lesions observed, bilaterally. Stone shadowing as noted above. There is normal renal echogenecity bilaterally. The bladder lumen has a smooth contour with no masses or debris.          Visit Complexity - G2211          Urinalysis Dipstick Dipstick Cont'd  Color: Yellow Bilirubin: Neg mg/dL  Appearance: Clear Ketones: Neg mg/dL  Specific Gravity: 7.829  Blood: Neg ery/uL  pH: 5.5 Protein: Neg mg/dL  Glucose: Neg mg/dL Urobilinogen: 0.2 mg/dL    Nitrites: Neg    Leukocyte Esterase: Neg leu/uL    ASSESSMENT:      ICD-10 Details  1 GU:   Ureteral calculus - N20.1 Left, Chronic, Stable     PLAN:           Orders X-Rays: Renal Ultrasound    KUB          Schedule Return Visit/Planned Activity: Return PRN          Document Letter(s):  Created for Patient: Clinical Summary         Notes:   -KUB today shows a persistent 4 mm calcification along the expected course of the left distal ureter, likely representing the previously identified stone from the spring. Patient is currently asymptomatic.   -The risks, benefits and alternatives of LEFT ESWL was discussed with the patient. I described the risks which include arrhythmia, kidney contusion, kidney hemorrhage, need for transfusion, back discomfort, flank ecchymosis, flank abrasion, inability to break up stone, inability to pass stone fragments, Steinstrasse, infection associated with obstructing stones, need for different surgical procedure and possible need for repeat shockwave lithotripsy. The patient voices understanding and wishes to proceed.

## 2023-07-17 NOTE — Discharge Instructions (Addendum)
See [facility] discharge instructions in chart.    Post Anesthesia Home Care Instructions  Activity: Get plenty of rest for the remainder of the day. A responsible individual must stay with you for 24 hours following the procedure.  For the next 24 hours, DO NOT: -Drive a car -Operate machinery -Drink alcoholic beverages -Take any medication unless instructed by your physician -Make any legal decisions or sign important papers.  Meals: Start with liquid foods such as gelatin or soup. Progress to regular foods as tolerated. Avoid greasy, spicy, heavy foods. If nausea and/or vomiting occur, drink only clear liquids until the nausea and/or vomiting subsides. Call your physician if vomiting continues.      

## 2023-07-17 NOTE — Op Note (Signed)
See [facility] OP note scanned into chart. Also because of the size, density, location and other factors that cannot be anticipated I feel this will likely be a staged procedure. This fact supersedes any indication in the scanned [facility] operative note to the contrary.  

## 2023-07-17 NOTE — Interval H&P Note (Signed)
History and Physical Interval Note:  07/17/2023 7:45 AM  Stephen Nash  has presented today for surgery, with the diagnosis of LEFT DISTAL URETERAL CALCULUS.  The various methods of treatment have been discussed with the patient and family. After consideration of risks, benefits and other options for treatment, the patient has consented to  Procedure(s) with comments: LEFT EXTRACORPOREAL SHOCK WAVE LITHOTRIPSY (ESWL) (Left) - 75 MINUTES as a surgical intervention.  The patient's history has been reviewed, patient examined, no change in status, stable for surgery.  I have reviewed the patient's chart and labs.  Questions were answered to the patient's satisfaction.     Crist Fat

## 2023-07-20 ENCOUNTER — Encounter (HOSPITAL_COMMUNITY): Payer: Self-pay

## 2023-07-20 ENCOUNTER — Emergency Department (HOSPITAL_COMMUNITY)
Admission: EM | Admit: 2023-07-20 | Discharge: 2023-07-20 | Disposition: A | Discharge status: Home / Self Care | Payer: Medicare Other | Attending: Emergency Medicine | Admitting: Emergency Medicine

## 2023-07-20 ENCOUNTER — Emergency Department (HOSPITAL_COMMUNITY): Payer: Medicare Other

## 2023-07-20 ENCOUNTER — Other Ambulatory Visit: Payer: Self-pay

## 2023-07-20 DIAGNOSIS — Z79899 Other long term (current) drug therapy: Secondary | ICD-10-CM | POA: Insufficient documentation

## 2023-07-20 DIAGNOSIS — N200 Calculus of kidney: Secondary | ICD-10-CM | POA: Insufficient documentation

## 2023-07-20 DIAGNOSIS — Z7982 Long term (current) use of aspirin: Secondary | ICD-10-CM | POA: Insufficient documentation

## 2023-07-20 DIAGNOSIS — I10 Essential (primary) hypertension: Secondary | ICD-10-CM | POA: Insufficient documentation

## 2023-07-20 DIAGNOSIS — R109 Unspecified abdominal pain: Secondary | ICD-10-CM

## 2023-07-20 DIAGNOSIS — I2510 Atherosclerotic heart disease of native coronary artery without angina pectoris: Secondary | ICD-10-CM | POA: Diagnosis not present

## 2023-07-20 LAB — CBC WITH DIFFERENTIAL/PLATELET
Abs Immature Granulocytes: 0.08 10*3/uL — ABNORMAL HIGH (ref 0.00–0.07)
Basophils Absolute: 0.1 10*3/uL (ref 0.0–0.1)
Basophils Relative: 0 %
Eosinophils Absolute: 0.2 10*3/uL (ref 0.0–0.5)
Eosinophils Relative: 1 %
HCT: 42.2 % (ref 39.0–52.0)
Hemoglobin: 14.9 g/dL (ref 13.0–17.0)
Immature Granulocytes: 1 %
Lymphocytes Relative: 14 %
Lymphs Abs: 2 10*3/uL (ref 0.7–4.0)
MCH: 31.1 pg (ref 26.0–34.0)
MCHC: 35.3 g/dL (ref 30.0–36.0)
MCV: 88.1 fL (ref 80.0–100.0)
Monocytes Absolute: 1.1 10*3/uL — ABNORMAL HIGH (ref 0.1–1.0)
Monocytes Relative: 8 %
Neutro Abs: 10.4 10*3/uL — ABNORMAL HIGH (ref 1.7–7.7)
Neutrophils Relative %: 76 %
Platelets: 195 10*3/uL (ref 150–400)
RBC: 4.79 MIL/uL (ref 4.22–5.81)
RDW: 13.7 % (ref 11.5–15.5)
WBC: 13.9 10*3/uL — ABNORMAL HIGH (ref 4.0–10.5)
nRBC: 0 % (ref 0.0–0.2)

## 2023-07-20 LAB — URINALYSIS, ROUTINE W REFLEX MICROSCOPIC
Bilirubin Urine: NEGATIVE
Glucose, UA: NEGATIVE mg/dL
Ketones, ur: NEGATIVE mg/dL
Leukocytes,Ua: NEGATIVE
Nitrite: NEGATIVE
Protein, ur: 30 mg/dL — AB
RBC / HPF: >50 RBC/hpf (ref 0–5)
Specific Gravity, Urine: 1.013 (ref 1.005–1.030)
pH: 5 (ref 5.0–8.0)

## 2023-07-20 LAB — BASIC METABOLIC PANEL
Anion gap: 13 (ref 5–15)
BUN: 16 mg/dL (ref 8–23)
CO2: 23 mmol/L (ref 22–32)
Calcium: 9.2 mg/dL (ref 8.9–10.3)
Chloride: 100 mmol/L (ref 98–111)
Creatinine, Ser: 1.41 mg/dL — ABNORMAL HIGH (ref 0.61–1.24)
GFR, Estimated: 54 mL/min — ABNORMAL LOW (ref >60)
Glucose, Bld: 131 mg/dL — ABNORMAL HIGH (ref 70–99)
Potassium: 2.8 mmol/L — ABNORMAL LOW (ref 3.5–5.1)
Sodium: 136 mmol/L (ref 135–145)

## 2023-07-20 LAB — TROPONIN I (HIGH SENSITIVITY)
Troponin I (High Sensitivity): 6 ng/L (ref <18)
Troponin I (High Sensitivity): 5 ng/L (ref <18)

## 2023-07-20 MED ORDER — NITROGLYCERIN 0.4 MG SL SUBL
0.4000 mg | SUBLINGUAL_TABLET | Freq: Once | SUBLINGUAL | Status: AC
  Administered 2023-07-20: 0.4 mg via SUBLINGUAL
  Filled 2023-07-20: qty 1

## 2023-07-20 MED ORDER — ACETAMINOPHEN 500 MG PO TABS
1000.0000 mg | ORAL_TABLET | Freq: Once | ORAL | Status: AC
  Administered 2023-07-20: 1000 mg via ORAL
  Filled 2023-07-20: qty 2

## 2023-07-20 MED ORDER — TRAMADOL HCL 50 MG PO TABS: 50.0000 mg | ORAL_TABLET | Freq: Four times a day (QID) | ORAL | 0 refills | Status: AC | PRN

## 2023-07-20 MED ORDER — ALUM & MAG HYDROXIDE-SIMETH 200-200-20 MG/5ML PO SUSP
30.0000 mL | Freq: Once | ORAL | Status: AC
  Administered 2023-07-20: 30 mL via ORAL
  Filled 2023-07-20: qty 30

## 2023-07-20 MED ORDER — POTASSIUM CHLORIDE 20 MEQ PO PACK
60.0000 meq | PACK | Freq: Once | ORAL | Status: AC
  Administered 2023-07-20: 60 meq via ORAL
  Filled 2023-07-20: qty 3

## 2023-07-20 MED ORDER — POTASSIUM CHLORIDE CRYS ER 20 MEQ PO TBCR: 20.0000 meq | EXTENDED_RELEASE_TABLET | Freq: Two times a day (BID) | ORAL | 0 refills | Status: DC

## 2023-07-20 MED ORDER — FAMOTIDINE 20 MG PO TABS
20.0000 mg | ORAL_TABLET | Freq: Once | ORAL | Status: AC
  Administered 2023-07-20: 20 mg via ORAL
  Filled 2023-07-20: qty 1

## 2023-07-20 MED ORDER — MORPHINE SULFATE (PF) 4 MG/ML IV SOLN
4.0000 mg | Freq: Once | INTRAVENOUS | Status: AC
  Administered 2023-07-20: 4 mg via INTRAVENOUS
  Filled 2023-07-20: qty 1

## 2023-07-20 MED ORDER — LACTATED RINGERS IV BOLUS
1000.0000 mL | Freq: Once | INTRAVENOUS | Status: AC
  Administered 2023-07-20: 1000 mL via INTRAVENOUS

## 2023-07-20 MED ORDER — ONDANSETRON HCL 4 MG/2ML IJ SOLN
4.0000 mg | Freq: Once | INTRAMUSCULAR | Status: AC
  Administered 2023-07-20: 4 mg via INTRAVENOUS
  Filled 2023-07-20: qty 2

## 2023-07-20 MED ORDER — ONDANSETRON 4 MG PO TBDP: 4.0000 mg | ORAL_TABLET | Freq: Three times a day (TID) | ORAL | 0 refills | Status: DC | PRN

## 2023-07-20 NOTE — ED Notes (Signed)
IV access established and Dk Chilton Si, Clinton Gallant and [employer] labeled and sent to lab.

## 2023-07-20 NOTE — ED Provider Notes (Signed)
[place] EMERGENCY DEPARTMENT AT [facility] Provider Note   CSN: [id number] Arrival date & time: 07/20/23  0720     History  Chief Complaint  Patient presents with   Flank Pain    Stephen Nash is a 67 y.o. male.  67 year old male presents today for concern of flank pain.  He states he recently had lithotripsy performed.  He passed multiple fragments of the stone however he states he has significant pain and difficulty voiding since around 430 this morning.  He has taken tramadol at home without significant improvement.  Denies fever.  No other complaints at this time.  The history is provided by the patient. No language interpreter was used.       Home Medications Prior to Admission medications   Medication Sig Start Date End Date Taking? Authorizing Provider  allopurinol (ZYLOPRIM) 300 MG tablet Take 300 mg by mouth daily.    [provider]  ammonium lactate (LAC-HYDRIN) 12 % lotion Apply 1 application. topically daily as needed for dry skin. 11/29/21   [provider]  aspirin 81 MG EC tablet Take 81 mg by mouth daily.    [provider]  atorvastatin (LIPITOR) 80 MG tablet Take 1 tablet (80 mg total) by mouth every evening. 03/07/22   Joseph Art, DO  Cholecalciferol (VITAMIN D-3 PO) Take 1 tablet by mouth daily.    [provider]  hydrOXYzine (ATARAX) 50 MG tablet Take 50 mg by mouth at bedtime as needed for sleep. 04/14/22   [provider]  losartan (COZAAR) 100 MG tablet Take 50 mg by mouth daily. 03/25/22   [provider]  ondansetron (ZOFRAN) 4 MG tablet Take 1 tablet (4 mg total) by mouth every 4 (four) hours as needed for nausea or vomiting. 12/15/22   Tanda Rockers A, DO  ondansetron (ZOFRAN) 4 MG tablet Take 1 tablet (4 mg total) by mouth every 8 (eight) hours as needed for nausea or vomiting. 07/17/23   Crist Fat, MD  pantoprazole (PROTONIX) 40 MG tablet Take 1 tablet (40 mg total) by mouth 2  (two) times daily. 02/28/22   Joseph Art, DO  sucralfate (CARAFATE) 1 GM/10ML suspension TAKE BY MOUTH 2 TIMES A DAY Patient taking differently: Take 1 g by mouth at bedtime as needed (ulcers). 02/05/22   Napoleon Form, MD  torsemide (DEMADEX) 10 MG tablet Take 0.5 tablets (5 mg total) by mouth daily. 03/07/22   Joseph Art, DO  traMADol (ULTRAM) 50 MG tablet Take 1-2 tablets (50-100 mg total) by mouth every 6 (six) hours as needed. 07/17/23   Crist Fat, MD      Allergies    Isosorbide and Percocet [oxycodone-acetaminophen]    Review of Systems   Review of Systems  Constitutional:  Negative for chills and fever.  Gastrointestinal:  Negative for abdominal pain and nausea.  Genitourinary:  Positive for flank pain. Negative for dysuria and frequency.  Neurological:  Negative for light-headedness.  All other systems reviewed and are negative.   Physical Exam Updated Vital Signs BP (!) 158/85 (BP Location: Left Arm)   Pulse 69   Temp 97.6 F (36.4 C) (Oral)   Resp 16   Ht 5\' 9"  (1.753 m)   Wt 96 kg   SpO2 100%   BMI 31.25 kg/m  Physical Exam Vitals and nursing note reviewed.  Constitutional:      General: He is not in acute distress.    Appearance: Normal  appearance. He is not ill-appearing.  HENT:     Head: Normocephalic and atraumatic.     Nose: Nose normal.  Eyes:     General: No scleral icterus.    Extraocular Movements: Extraocular movements intact.     Conjunctiva/sclera: Conjunctivae normal.  Cardiovascular:     Rate and Rhythm: Normal rate and regular rhythm.     Heart sounds: Normal heart sounds.  Pulmonary:     Effort: Pulmonary effort is normal. No respiratory distress.     Breath sounds: Normal breath sounds. No wheezing or rales.  Abdominal:     General: There is no distension.     Tenderness: There is no abdominal tenderness.  Musculoskeletal:        General: Normal range of motion.     Cervical back: Normal range of motion.   Skin:    General: Skin is warm and dry.  Neurological:     General: No focal deficit present.     Mental Status: He is alert. Mental status is at baseline.     ED Results / Procedures / Treatments   Labs (all labs ordered are listed, but only abnormal results are displayed) Labs Reviewed  CBC WITH DIFFERENTIAL/PLATELET - Abnormal; Notable for the following components:      Result Value   WBC 13.9 (*)    Neutro Abs 10.4 (*)    Monocytes Absolute 1.1 (*)    Abs Immature Granulocytes 0.08 (*)    All other components within normal limits  BASIC METABOLIC PANEL  URINALYSIS, ROUTINE W REFLEX MICROSCOPIC    EKG None  Radiology No results found.  Procedures Procedures    Medications Ordered in ED Medications  morphine (PF) 4 MG/ML injection 4 mg (4 mg Intravenous Given 07/20/23 0758)  ondansetron (ZOFRAN) injection 4 mg (4 mg Intravenous Given 07/20/23 0758)    ED Course/ Medical Decision Making/ A&P Clinical Course as of 07/20/23 0815  Mon Jul 20, 2023  0814 As patient returned from CT imaging notified by her nurse that patient developed chest pressure with associated dyspnea.  Ordered EKG, chest x-ray, and troponin. [AA]    Clinical Course User Index [AA] Marita Kansas, PA-C                                 Medical Decision Making Amount and/or Complexity of Data Reviewed Labs: ordered. Radiology: ordered.  Risk OTC drugs. Prescription drug management.   Medical Decision Making / ED Course   This patient presents to the ED for concern of flank pain, this involves an extensive number of treatment options, and is a complaint that carries with it a high risk of complications and morbidity.  The differential diagnosis includes nephrolithiasis, pyelonephritis, UTI, obstructing stone  MDM: 67 year old male presents today for concern of flank pain.  Recently had lithotripsy performed.  Concern for obstructing stone.  Will obtain labs and imaging and provide symptom  management.  CBC with leukocytosis of 13.9 without significant left shift.  No anemia.  BMP shows hypokalemia 2.8, mild renal insufficiency.  Potassium repletion given.  Will give additional few days worth.  UA without evidence of UTI.  Hemodynamically stable.  CT renal stone study obtained.  Shows 5 mm stone in the left distal ureter without obstruction.  From an ACS standpoint patient has chest x-ray without acute cardiopulmonary process.  Troponin is negative x 2.  EKG with without acute ischemic changes.  Discussed with urology recommends no intervention from their standpoint.  Patient is appropriate for discharge and to follow-up outpatient.  Short course of pain medication prescribed.  Lab Tests: -I ordered, reviewed, and interpreted labs.   The pertinent results include:   Labs Reviewed  CBC WITH DIFFERENTIAL/PLATELET - Abnormal; Notable for the following components:      Result Value   WBC 13.9 (*)    Neutro Abs 10.4 (*)    Monocytes Absolute 1.1 (*)    Abs Immature Granulocytes 0.08 (*)    All other components within normal limits  BASIC METABOLIC PANEL  URINALYSIS, ROUTINE W REFLEX MICROSCOPIC  TROPONIN I (HIGH SENSITIVITY)      EKG  EKG Interpretation Date/Time:    Ventricular Rate:    PR Interval:    QRS Duration:    QT Interval:    QTC Calculation:   R Axis:      Text Interpretation:           Imaging Studies ordered: I ordered imaging studies including ct renal stone study, cxr I independently visualized and interpreted imaging. I agree with the radiologist interpretation   Medicines ordered and prescription drug management: Meds ordered this encounter  Medications   morphine (PF) 4 MG/ML injection 4 mg   ondansetron (ZOFRAN) injection 4 mg    -I have reviewed the patients home medicines and have made adjustments as needed  Consultations Obtained: I requested consultation with the urology,  and discussed lab and imaging findings as well as  pertinent plan - they recommend: as above   Reevaluation: After the interventions noted above, I reevaluated the patient and found that they have :improved  Co morbidities that complicate the patient evaluation  Past Medical History:  Diagnosis Date   Arthritis    Coronary artery disease    Dyspnea    Elevated cholesterol    GERD (gastroesophageal reflux disease)    High blood pressure    History of blood clots    History of hiatal hernia    History of kidney stones    Kidney stone    Pneumonia    PONV (postoperative nausea and vomiting)    Sleep apnea    Slow to wake up after anesthesia       Dispostion: Patient is appropriate for discharge.  Discharged in stable condition peer return precaution discussed.  Patient voices understanding and is in agreement with plan.  Admission considered however patient hemodynamically stable.  Stable from urology standpoint for discharge.  Final Clinical Impression(s) / ED Diagnoses Final diagnoses:  Flank pain  Nephrolithiasis    Rx / DC Orders ED Discharge Orders          Ordered    traMADol (ULTRAM) 50 MG tablet  Every 6 hours PRN        07/20/23 1449    ondansetron (ZOFRAN-ODT) 4 MG disintegrating tablet  Every 8 hours PRN        07/20/23 1449              Marita Kansas, PA-C 07/20/23 1450    Cathren Laine, MD 07/20/23 2125

## 2023-07-20 NOTE — Plan of Care (Signed)
[facility] Tonsillectomy/Adenoidectomy, Postoperative PEDS care plan entered in error.

## 2023-07-20 NOTE — Discharge Instructions (Addendum)
CT scan showed a 5 mm stone in the left ureter.  No obstruction.  Discussed with urology.  They feel like you okay for discharge and to follow-up in clinic if need be.  Workup otherwise reassuring.  From a chest pain standpoint no concerning cause of your chest pain.  Follow-up with your primary care provider.  Short course of pain medication sent into your pharmacy. Also sent in a short course of potassium supplement to the pharmacy for you to take over the next few days.  This can be repeated by your primary care provider to ensure that it returns to normal level.

## 2023-07-20 NOTE — ED Triage Notes (Signed)
Pt arrived with wife reporting L flank pain.Had surgery on Friday for kidney stone. States has been passing stones and fine since. However, pain started 0430 this morning, unbearable. Has been taking Tramadol for pain

## 2024-04-20 ENCOUNTER — Telehealth: Payer: Self-pay

## 2024-04-20 NOTE — Telephone Encounter (Addendum)
 PCP office calls. The patient is there with complaints of choking and feels like something is always stuck in his esophagus. He is not regurgitating. He is able to swallow and retain liquids and foods. This started after eating a chopped steak. PCP would like the patient seen asap. Appointment scheduled for 04/21/24 at 11:00 am. Advised to arrive at 10:50 am to the 3rd floor. If he acutely worsens, he will go to the [facility].

## 2024-04-21 ENCOUNTER — Ambulatory Visit: Admitting: Gastroenterology

## 2024-04-21 ENCOUNTER — Encounter: Payer: Self-pay | Admitting: Gastroenterology

## 2024-04-21 VITALS — BP 100/64 | HR 68 | Ht 68.0 in | Wt 214.2 lb

## 2024-04-21 DIAGNOSIS — Z791 Long term (current) use of non-steroidal anti-inflammatories (NSAID): Secondary | ICD-10-CM | POA: Insufficient documentation

## 2024-04-21 DIAGNOSIS — R09A2 Foreign body sensation, throat: Secondary | ICD-10-CM

## 2024-04-21 DIAGNOSIS — R1319 Other dysphagia: Secondary | ICD-10-CM

## 2024-04-21 DIAGNOSIS — R1013 Epigastric pain: Secondary | ICD-10-CM | POA: Diagnosis not present

## 2024-04-21 DIAGNOSIS — R110 Nausea: Secondary | ICD-10-CM | POA: Insufficient documentation

## 2024-04-21 MED ORDER — PANTOPRAZOLE SODIUM 40 MG PO TBEC: 40.0000 mg | DELAYED_RELEASE_TABLET | Freq: Every day | ORAL | 3 refills | Status: DC

## 2024-04-21 NOTE — Progress Notes (Signed)
 04/21/2024 Stephen Nash [id number] 29-Apr-1956   HISTORY OF PRESENT ILLNESS: This is a 68 year old male who is a patient of Dr. Trenna.  He has a history of large hiatal hernia that he had repaired in September 2023.  Prior to that he had required acid reflux therapy, but has not needed any acid reflux medication since having that surgery.  He presents here today with complaints of epigastric abdominal pain, nausea, dry heaves with feeling that food is getting stuck at the top of his stomach.  He says that this occurred suddenly on Friday after eating some chopped hamburger steak.  Had 1 similar episode back in April, but that passed and he had had no recurrence until last week.  He feels bloated in his upper abdomen.  He has been taking diclofenac daily for the past couple of months for some arthritic hip pain.  He is tolerating liquids, but has not even tried to eat any solid food in the past couple of days.  He says it just feels like it sticks in the epigastrium.  He cannot vomit because of his surgery, but once again it does cause him to be nauseous and he dry heaves.  EGD 09/2021:  - 5 cm hiatal hernia. - No endoscopic esophageal abnormality to explain patient' s dysphagia. - Gastritis. Biopsied. - Normal examined duodenum.  Hiatal hernia repair 05/2022.  Past Medical History:  Diagnosis Date   Arthritis    Coronary artery disease    Dyspnea    Elevated cholesterol    GERD (gastroesophageal reflux disease)    High blood pressure    History of blood clots    History of hiatal hernia    History of kidney stones    Kidney stone    Pneumonia    PONV (postoperative nausea and vomiting)    Sleep apnea    Slow to wake up after anesthesia    Past Surgical History:  Procedure Laterality Date   CATARACT EXTRACTION, BILATERAL Bilateral    06/2022, 07/2022   CHOLECYSTECTOMY     coronary artery stent  2017   ESOPHAGEAL MANOMETRY N/A 10/25/2021   Procedure: ESOPHAGEAL MANOMETRY  (EM);  Surgeon: Shila Gustav GAILS, MD;  Location: [facility];  Service: Endoscopy;  Laterality: N/A;   EXTRACORPOREAL SHOCK WAVE LITHOTRIPSY Left 07/17/2023   Procedure: LEFT EXTRACORPOREAL SHOCK WAVE LITHOTRIPSY (ESWL);  Surgeon: Cam Morene ORN, MD;  Location: [facility];  Service: Urology;  Laterality: Left;  75 MINUTES   EYE SURGERY Left    repaired muscles in left eye   HERNIA REPAIR     INSERTION OF MESH N/A 06/17/2022   Procedure: INSERTION OF MESH;  Surgeon: Rubin Calamity, MD;  Location: [facility] OR;  Service: General;  Laterality: N/A;   XI ROBOTIC ASSISTED HIATAL HERNIA REPAIR N/A 06/17/2022   Procedure: XI ROBOTIC ASSISTED HIATAL HERNIA REPAIR WITH MESH AND FUNDOPLICATION;  Surgeon: Rubin Calamity, MD;  Location: [facility] OR;  Service: General;  Laterality: N/A;    reports that he has never smoked. He has never used smokeless tobacco. He reports that he does not drink alcohol and does not use drugs. family history includes Colon cancer in his maternal grandfather; Heart failure in his father and mother; Kidney cancer in an other family member; Stroke in his father. Allergies  Allergen Reactions   Codeine Other (See Comments)    Other Reaction(s): Psychosis   Isosorbide Other (See Comments) and Swelling   Percocet [Oxycodone-Acetaminophen ]  Hallucinations       Outpatient Encounter Medications as of 04/21/2024  Medication Sig   allopurinol  (ZYLOPRIM ) 300 MG tablet Take 300 mg by mouth daily.   aspirin  81 MG EC tablet Take 81 mg by mouth daily.   Cholecalciferol (VITAMIN D-3 PO) Take 1 tablet by mouth daily.   diclofenac (VOLTAREN) 75 MG EC tablet Take 75 mg by mouth 2 (two) times daily.   hydrOXYzine (ATARAX) 50 MG tablet Take 50 mg by mouth at bedtime as needed for sleep.   losartan  (COZAAR ) 100 MG tablet Take 50 mg by mouth daily.   nitroGLYCERIN  (NITROSTAT ) 0.4 MG SL tablet Place 1 tablet under the tongue every 5 (five) minutes as needed.   REPATHA  SURECLICK 140 MG/ML SOAJ Inject 140 mg into the skin every 14 (fourteen) days.   torsemide  (DEMADEX ) 10 MG tablet Take 0.5 tablets (5 mg total) by mouth daily.   ammonium lactate (LAC-HYDRIN) 12 % lotion Apply 1 application. topically daily as needed for dry skin. (Patient not taking: Reported on 04/21/2024)   traMADol  (ULTRAM ) 50 MG tablet Take 1-2 tablets (50-100 mg total) by mouth every 6 (six) hours as needed. (Patient not taking: Reported on 04/21/2024)   [DISCONTINUED] atorvastatin  (LIPITOR) 80 MG tablet Take 1 tablet (80 mg total) by mouth every evening.   [DISCONTINUED] ondansetron  (ZOFRAN ) 4 MG tablet Take 1 tablet (4 mg total) by mouth every 4 (four) hours as needed for nausea or vomiting.   [DISCONTINUED] ondansetron  (ZOFRAN ) 4 MG tablet Take 1 tablet (4 mg total) by mouth every 8 (eight) hours as needed for nausea or vomiting.   [DISCONTINUED] ondansetron  (ZOFRAN -ODT) 4 MG disintegrating tablet Take 1 tablet (4 mg total) by mouth every 8 (eight) hours as needed.   [DISCONTINUED] pantoprazole  (PROTONIX ) 40 MG tablet Take 1 tablet (40 mg total) by mouth 2 (two) times daily.   [DISCONTINUED] potassium chloride  SA (KLOR-CON  M) 20 MEQ tablet Take 1 tablet (20 mEq total) by mouth 2 (two) times daily for 5 days.   [DISCONTINUED] sucralfate  (CARAFATE ) 1 GM/10ML suspension TAKE 10ML BY MOUTH 2 TIMES A DAY (Patient taking differently: Take 1 g by mouth at bedtime as needed (ulcers).)   No facility-administered encounter medications on file as of 04/21/2024.    REVIEW OF SYSTEMS  : All other systems reviewed and negative except where noted in the History of Present Illness.   PHYSICAL EXAM: BP 100/64 (BP Location: Left Arm, Patient Position: Sitting, Cuff Size: Normal)   Pulse 68   Ht 5' 8 (1.727 m) Comment: height measured without shoes  Wt 214 lb 4 oz (97.2 kg)   BMI 32.58 kg/m  General: Well developed white male in no acute distress Head: Normocephalic and atraumatic Eyes:  Sclerae  anicteric, conjunctiva pink. Ears: Normal auditory acuity. Lungs: Clear throughout to auscultation; no W/R/R. Heart: Regular rate and rhythm; no M/R/G. Abdomen: Soft, non-distended.  BS present.  Epigastric TTP. Musculoskeletal: Symmetrical with no gross deformities  Skin: No lesions on visible extremities Extremities: No edema  Neurological: Alert oriented x 4, grossly non-focal Psychological:  Alert and cooperative. Normal mood and affect  ASSESSMENT AND PLAN: *68 year old male with history hiatal hernia repair in 05/2022 who had sudden onset of epigastric abdominal pain and nausea/dry heaves with feeling like food is sitting in the epigastrium after eating.  This started last week.  Is taking diclofenac daily for the past couple of months.  No longer needing PPI for reflux since his surgery.  -EGD  with Dr. Nandigam early next week.  The risks, benefits, and alternatives to EGD were discussed with the patient and he consents to proceed.   -Discontinue diclofenac. -Restart pantoprazole  40 mg daily (says that he has some at home).   CC:  Carlon Mitzie SAUNDERS, FNP

## 2024-04-21 NOTE — Patient Instructions (Addendum)
 Stop Diclofenac.   We have sent the following medications to your pharmacy for you to pick up at your convenience: Start Pantoprazole  40 mg daily 30-60 minutes before breakfast.   You have been scheduled for an endoscopy. Please follow written instructions given to you at your visit today.  If you use inhalers (even only as needed), please bring them with you on the day of your procedure.  If you take any of the following medications, they will need to be adjusted prior to your procedure:   DO NOT TAKE 7 DAYS PRIOR TO TEST- Trulicity (dulaglutide) Ozempic, Wegovy (semaglutide) Mounjaro (tirzepatide) Bydureon Bcise (exanatide extended release)  DO NOT TAKE 1 DAY PRIOR TO YOUR TEST Rybelsus (semaglutide) Adlyxin (lixisenatide) Victoza (liraglutide) Byetta (exanatide) __________________________________________________________________

## 2024-04-25 ENCOUNTER — Encounter: Payer: Self-pay | Admitting: Gastroenterology

## 2024-04-25 ENCOUNTER — Ambulatory Visit (AMBULATORY_SURGERY_CENTER): Admitting: Gastroenterology

## 2024-04-25 VITALS — BP 102/69 | HR 55 | Temp 97.9°F | Resp 10 | Ht 68.0 in | Wt 214.0 lb

## 2024-04-25 DIAGNOSIS — R1013 Epigastric pain: Secondary | ICD-10-CM

## 2024-04-25 DIAGNOSIS — K259 Gastric ulcer, unspecified as acute or chronic, without hemorrhage or perforation: Secondary | ICD-10-CM

## 2024-04-25 DIAGNOSIS — K3189 Other diseases of stomach and duodenum: Secondary | ICD-10-CM

## 2024-04-25 DIAGNOSIS — K2970 Gastritis, unspecified, without bleeding: Secondary | ICD-10-CM

## 2024-04-25 DIAGNOSIS — K2990 Gastroduodenitis, unspecified, without bleeding: Secondary | ICD-10-CM

## 2024-04-25 MED ORDER — PANTOPRAZOLE SODIUM 40 MG PO TBEC: 40.0000 mg | DELAYED_RELEASE_TABLET | Freq: Two times a day (BID) | ORAL | 3 refills | Status: AC

## 2024-04-25 MED ORDER — SODIUM CHLORIDE 0.9 % IV SOLN: 500.0000 mL | INTRAVENOUS | Status: DC

## 2024-04-25 NOTE — Op Note (Signed)
 [facility] Patient Name: Stephen Nash Procedure Date: 04/25/2024 9:44 AM MRN: [id number] Endoscopist: Gustav ALONSO Mcgee , MD, [id number] Age: 68 Referring MD:  Date of Birth: 03-29-1956 Gender: Male Account #: [id number] Procedure:                Upper GI endoscopy Indications:              Epigastric abdominal pain, Nausea Medicines:                Monitored Anesthesia Care Procedure:                Pre-Anesthesia Assessment:                           - Prior to the procedure, a History and Physical                            was performed, and patient medications and                            allergies were reviewed. The patient's tolerance of                            previous anesthesia was also reviewed. The risks                            and benefits of the procedure and the sedation                            options and risks were discussed with the patient.                            All questions were answered, and informed consent                            was obtained. Prior Anticoagulants: The patient has                            taken no anticoagulant or antiplatelet agents. ASA                            Grade Assessment: III - A patient with severe                            systemic disease. After reviewing the risks and                            benefits, the patient was deemed in satisfactory                            condition to undergo the procedure.                           After obtaining informed consent, the endoscope was  passed under direct vision. Throughout the                            procedure, the patient's blood pressure, pulse, and                            oxygen saturations were monitored continuously. The                            Olympus Scope [id number] was introduced through the                            mouth, and advanced to the second part of duodenum.                            The upper  GI endoscopy was accomplished without                            difficulty. The patient tolerated the procedure                            well. Scope In: Scope Out: Findings:                 The Z-line was regular and was found 40 cm from the                            incisors.                           No gross lesions were noted in the entire esophagus.                           Evidence of a Nissen fundoplication was found at                            the gastroesophageal junction. The wrap appeared                            intact. This was traversed.                           Patchy mild inflammation characterized by                            congestion (edema), erythema, aphthous ulcerations                            and shallow ulcerations was found in the gastric                            body, in the gastric antrum and in the prepyloric                            region of the stomach. Biopsies were taken  with a                            cold forceps for Helicobacter pylori testing.                           Patchy mildly erythematous mucosa without active                            bleeding and with no stigmata of bleeding was found                            in the duodenal bulb.                           The second portion of the duodenum was normal.                           The cardia and gastric fundus were normal on                            retroflexion. Complications:            No immediate complications. Estimated Blood Loss:     Estimated blood loss was minimal. Impression:               - Z-line regular, 40 cm from the incisors.                           - No gross lesions in the entire esophagus.                           - A Nissen fundoplication was found. The wrap                            appears intact.                           - Gastritis. Biopsied.                           - Erythematous duodenopathy.                           - Normal second  portion of the duodenum. Recommendation:           - Resume previous diet.                           - Continue present medications.                           - Await pathology results.                           - No ibuprofen, naproxen, or other non-steroidal  anti-inflammatory drugs.                           - Use Protonix  (pantoprazole ) 40 mg PO BID X 2                            months and then decrease to 40mg  daily as needed. Marieli Rudy V. Harsha Yusko, MD 04/25/2024 10:21:08 AM This report has been signed electronically.

## 2024-04-25 NOTE — Patient Instructions (Addendum)
 Resume previous diet and medications. Awaiting pathology results.  No ibuprofen, Naproxen, or other non-steroidal anti-inflammatory drugs. Use Protonix  40 mg BID x 2 months and then decrease to 40 mg daily as needed.  YOU HAD AN ENDOSCOPIC PROCEDURE TODAY AT THE [facility]:   Refer to the procedure report that was given to you for any specific questions about what was found during the examination.  If the procedure report does not answer your questions, please call your gastroenterologist to clarify.  If you requested that your care partner not be given the details of your procedure findings, then the procedure report has been included in a sealed envelope for you to review at your convenience later.  YOU SHOULD EXPECT: Some feelings of bloating in the abdomen. Passage of more gas than usual.  Walking can help get rid of the air that was put into your GI tract during the procedure and reduce the bloating. If you had a lower endoscopy (such as a colonoscopy or flexible sigmoidoscopy) you may notice spotting of blood in your stool or on the toilet paper. If you underwent a bowel prep for your procedure, you may not have a normal bowel movement for a few days.  Please Note:  You might notice some irritation and congestion in your nose or some drainage.  This is from the oxygen used during your procedure.  There is no need for concern and it should clear up in a day or so.  SYMPTOMS TO REPORT IMMEDIATELY:  Following upper endoscopy (EGD)  Vomiting of blood or coffee ground material  New chest pain or pain under the shoulder blades  Painful or persistently difficult swallowing  New shortness of breath  Fever of 100F or higher  Black, tarry-looking stools  For urgent or emergent issues, a gastroenterologist can be reached at any hour by calling [phone]. Do not use MyChart messaging for urgent concerns.    DIET:  We do recommend a small meal at first, but then you may proceed  to your regular diet.  Drink plenty of fluids but you should avoid alcoholic beverages for 24 hours.  ACTIVITY:  You should plan to take it easy for the rest of today and you should NOT DRIVE or use heavy machinery until tomorrow (because of the sedation medicines used during the test).    FOLLOW UP: Our staff will call the number listed on your records the next business day following your procedure.  We will call around 7:15- 8:00 am to check on you and address any questions or concerns that you may have regarding the information given to you following your procedure. If we do not reach you, we will leave a message.     If any biopsies were taken you will be contacted by phone or by letter within the next 1-3 weeks.  Please call us  at [phone] if you have not heard about the biopsies in 3 weeks.    SIGNATURES/CONFIDENTIALITY: You and/or your care partner have signed paperwork which will be entered into your electronic medical record.  These signatures attest to the fact that that the information above on your After Visit Summary has been reviewed and is understood.  Full responsibility of the confidentiality of this discharge information lies with you and/or your care-partner.

## 2024-04-25 NOTE — Progress Notes (Signed)
 Pt's states no medical or surgical changes since previsit or office visit.

## 2024-04-25 NOTE — Progress Notes (Signed)
 2841 Robinul 0.1 mg IV given due large amount of secretions upon assessment.  MD made aware, vss

## 2024-04-25 NOTE — Progress Notes (Signed)
 [facility] History and Physical   Primary Care Physician:  Carlon Mitzie SAUNDERS, FNP   Reason for Procedure:  Epigastric abd pain, nausea  Plan:    EGD  with possible interventions as needed     HPI: Stephen Nash is a very pleasant 68 y.o. male here for evaluation of epigastric pain and nausea. S/p hiatal hernia repair in 2023 and frequent NSAID use  The risks and benefits as well as alternatives of endoscopic procedure(s) have been discussed and reviewed. All questions answered. The patient agrees to proceed.    Past Medical History:  Diagnosis Date   Allergy    Arthritis    Coronary artery disease    Dyspnea    Elevated cholesterol    GERD (gastroesophageal reflux disease)    High blood pressure    History of blood clots    History of hiatal hernia    History of kidney stones    Kidney stone    Pneumonia    PONV (postoperative nausea and vomiting)    Sleep apnea    Slow to wake up after anesthesia     Past Surgical History:  Procedure Laterality Date   CATARACT EXTRACTION, BILATERAL Bilateral    06/2022, 07/2022   CHOLECYSTECTOMY     coronary artery stent  2017   ESOPHAGEAL MANOMETRY N/A 10/25/2021   Procedure: ESOPHAGEAL MANOMETRY (EM);  Surgeon: Shila Gustav GAILS, MD;  Location: [facility];  Service: Endoscopy;  Laterality: N/A;   EXTRACORPOREAL SHOCK WAVE LITHOTRIPSY Left 07/17/2023   Procedure: LEFT EXTRACORPOREAL SHOCK WAVE LITHOTRIPSY (ESWL);  Surgeon: Cam Morene ORN, MD;  Location: [facility];  Service: Urology;  Laterality: Left;  75 MINUTES   EYE SURGERY Left    repaired muscles in left eye   HERNIA REPAIR     INSERTION OF MESH N/A 06/17/2022   Procedure: INSERTION OF MESH;  Surgeon: Rubin Calamity, MD;  Location: [facility] OR;  Service: General;  Laterality: N/A;   UPPER GASTROINTESTINAL ENDOSCOPY     XI ROBOTIC ASSISTED HIATAL HERNIA REPAIR N/A 06/17/2022   Procedure: XI ROBOTIC ASSISTED HIATAL HERNIA REPAIR WITH MESH AND  FUNDOPLICATION;  Surgeon: Rubin Calamity, MD;  Location: [facility] OR;  Service: General;  Laterality: N/A;    Prior to Admission medications   Medication Sig Start Date End Date Taking? Authorizing Provider  allopurinol  (ZYLOPRIM ) 300 MG tablet Take 300 mg by mouth daily.   Yes [provider]  aspirin  81 MG EC tablet Take 81 mg by mouth daily.   Yes [provider]  Cholecalciferol (VITAMIN D-3 PO) Take 1 tablet by mouth daily.   Yes [provider]  ezetimibe (ZETIA) 10 MG tablet Take 10 mg by mouth daily. 04/20/24  Yes [provider]  losartan  (COZAAR ) 100 MG tablet Take 50 mg by mouth daily. 03/25/22  Yes [provider]  pantoprazole  (PROTONIX ) 40 MG tablet Take 1 tablet (40 mg total) by mouth daily. 04/21/24  Yes Zehr, Jessica D, PA-C  torsemide  (DEMADEX ) 10 MG tablet Take 0.5 tablets (5 mg total) by mouth daily. 03/07/22  Yes Vann, Jessica U, DO  ammonium lactate (LAC-HYDRIN) 12 % lotion Apply 1 application. topically daily as needed for dry skin. Patient not taking: No sig reported 11/29/21   [provider]  diclofenac (VOLTAREN) 75 MG EC tablet Take 75 mg by mouth 2 (two) times daily. Patient not taking: Reported on 04/25/2024 01/22/24   [provider]  hydrOXYzine (ATARAX) 50 MG tablet Take 50 mg  by mouth at bedtime as needed for sleep. 04/14/22   [provider]  nitroGLYCERIN  (NITROSTAT ) 0.4 MG SL tablet Place 1 tablet under the tongue every 5 (five) minutes as needed. 08/25/23   [provider]  REPATHA SURECLICK 140 MG/ML SOAJ Inject 140 mg into the skin every 14 (fourteen) days.    [provider]  traMADol  (ULTRAM ) 50 MG tablet Take 1-2 tablets (50-100 mg total) by mouth every 6 (six) hours as needed. Patient not taking: No sig reported 07/20/23   Hildegard Loge, PA-C    Current Outpatient Medications  Medication Sig Dispense Refill   allopurinol  (ZYLOPRIM ) 300 MG tablet Take 300 mg by mouth daily.      aspirin  81 MG EC tablet Take 81 mg by mouth daily.     Cholecalciferol (VITAMIN D-3 PO) Take 1 tablet by mouth daily.     ezetimibe (ZETIA) 10 MG tablet Take 10 mg by mouth daily.     losartan  (COZAAR ) 100 MG tablet Take 50 mg by mouth daily.     pantoprazole  (PROTONIX ) 40 MG tablet Take 1 tablet (40 mg total) by mouth daily. 90 tablet 3   torsemide  (DEMADEX ) 10 MG tablet Take 0.5 tablets (5 mg total) by mouth daily.     ammonium lactate (LAC-HYDRIN) 12 % lotion Apply 1 application. topically daily as needed for dry skin. (Patient not taking: No sig reported)     diclofenac (VOLTAREN) 75 MG EC tablet Take 75 mg by mouth 2 (two) times daily. (Patient not taking: Reported on 04/25/2024)     hydrOXYzine (ATARAX) 50 MG tablet Take 50 mg by mouth at bedtime as needed for sleep.     nitroGLYCERIN  (NITROSTAT ) 0.4 MG SL tablet Place 1 tablet under the tongue every 5 (five) minutes as needed.     REPATHA SURECLICK 140 MG/ML SOAJ Inject 140 mg into the skin every 14 (fourteen) days.     traMADol  (ULTRAM ) 50 MG tablet Take 1-2 tablets (50-100 mg total) by mouth every 6 (six) hours as needed. (Patient not taking: No sig reported) 12 tablet 0   Current Facility-Administered Medications  Medication Dose Route Frequency Provider Last Rate Last Admin   0.9 %  sodium chloride  infusion  500 mL Intravenous Continuous Kathlean Cinco V, MD        Allergies as of 04/25/2024 - Review Complete 04/25/2024  Allergen Reaction Noted   Codeine Other (See Comments) 04/05/2020   Isosorbide Other (See Comments) 09/11/2021   Percocet [oxycodone-acetaminophen ] Other (See Comments) 10/10/2019    Family History  Problem Relation Age of Onset   Heart failure Mother    Stroke Father    Heart failure Father    Colon cancer Maternal Grandfather    Kidney cancer Other        uncle   Esophageal cancer Neg Hx    Stomach cancer Neg Hx    Rectal cancer Neg Hx     Social History   Socioeconomic History   Marital  status: Married    Spouse name: Not on file   Number of children: 1   Years of education: Not on file   Highest education level: Not on file  Occupational History   Not on file  Tobacco Use   Smoking status: Never   Smokeless tobacco: Never  Vaping Use   Vaping status: Never Used  Substance and Sexual Activity   Alcohol use: Never   Drug use: Never   Sexual activity: Not on file  Other Topics  Concern   Not on file  Social History Narrative   Not on file   Social Drivers of Health   Financial Resource Strain: Not on file  Food Insecurity: Low Risk  (03/10/2024)   Received from [facility]   Hunger Vital Sign    Within the past 12 months, you worried that your food would run out before you got money to buy more: Never true    Within the past 12 months, the food you bought just didn't last and you didn't have money to get more. : Never true  Transportation Needs: No Transportation Needs (03/10/2024)   Received from [facility]    In the past 12 months, has lack of reliable transportation kept you from medical appointments, meetings, work or from getting things needed for daily living? : No  Physical Activity: Not on file  Stress: Not on file  Social Connections: Not on file  Intimate Partner Violence: Not on file    Review of Systems:  All other review of systems negative except as mentioned in the HPI.  Physical Exam: Vital signs in last 24 hours: BP 116/72   Pulse (!) 56   Temp 97.9 F (36.6 C) (Temporal)   Ht 5' 8 (1.727 m)   Wt 214 lb (97.1 kg)   SpO2 98%   BMI 32.54 kg/m  General:   Alert, NAD Lungs:  Clear .   Heart:  Regular rate and rhythm Abdomen:  Soft, nontender and nondistended. Neuro/Psych:  Alert and cooperative. Normal mood and affect. A and O x 3  Reviewed labs, radiology imaging, old records and pertinent past GI work up  Patient is appropriate for planned procedure(s) and anesthesia in an ambulatory setting   K. Veena  Vinie Charity , MD [phone]

## 2024-04-25 NOTE — Progress Notes (Signed)
 Report given to PACU, vss

## 2024-04-26 ENCOUNTER — Telehealth: Payer: Self-pay

## 2024-04-26 NOTE — Telephone Encounter (Signed)
  Follow up Call-     04/25/2024    8:38 AM 10/02/2021    1:04 PM  Call back number  Post procedure Call Back phone  # [phone] wife phone  Permission to leave phone message Yes Yes     Patient questions:  Do you have a fever, pain , or abdominal swelling? No. Pain Score  0 *  Have you tolerated food without any problems? Yes.    Have you been able to return to your normal activities? Yes.    Do you have any questions about your discharge instructions: Diet   No. Medications  No. Follow up visit  No.  Do you have questions or concerns about your Care? No.  Actions: * If pain score is 4 or above: No action needed, pain <4.

## 2024-04-27 LAB — SURGICAL PATHOLOGY

## 2024-08-01 ENCOUNTER — Ambulatory Visit: Payer: Self-pay | Admitting: Gastroenterology

## 2024-10-20 ENCOUNTER — Ambulatory Visit (HOSPITAL_BASED_OUTPATIENT_CLINIC_OR_DEPARTMENT_OTHER)
Admission: RE | Admit: 2024-10-20 | Discharge: 2024-10-20 | Disposition: A | Discharge status: Home / Self Care | Source: Ambulatory Visit | Attending: Family | Admitting: Family

## 2024-10-20 ENCOUNTER — Other Ambulatory Visit (HOSPITAL_BASED_OUTPATIENT_CLINIC_OR_DEPARTMENT_OTHER): Payer: Self-pay | Admitting: Family

## 2024-10-20 DIAGNOSIS — M545 Low back pain, unspecified: Secondary | ICD-10-CM | POA: Diagnosis not present
